# Patient Record
Sex: Female | Born: 1940 | Race: Black or African American | Hispanic: No | Marital: Single | Smoking: Former smoker
Health system: Southern US, Community
[De-identification: ages and names within clinical notes are randomized; demographics above are authoritative.]

## PROBLEM LIST (undated history)

## (undated) DIAGNOSIS — R7303 Prediabetes: Secondary | ICD-10-CM

## (undated) HISTORY — PX: BACK SURGERY: SHX140

---

## 1998-02-28 ENCOUNTER — Encounter: Admission: RE | Admit: 1998-02-28 | Discharge: 1998-05-27 | Payer: Self-pay | Admitting: Anesthesiology

## 1998-05-27 ENCOUNTER — Encounter: Admission: RE | Admit: 1998-05-27 | Discharge: 1998-08-15 | Payer: Self-pay | Admitting: Anesthesiology

## 1998-08-15 ENCOUNTER — Encounter: Admission: RE | Admit: 1998-08-15 | Discharge: 1998-11-13 | Payer: Self-pay | Admitting: Anesthesiology

## 1998-11-14 ENCOUNTER — Encounter: Admission: RE | Admit: 1998-11-14 | Discharge: 1999-01-23 | Payer: Self-pay | Admitting: Anesthesiology

## 1999-01-06 ENCOUNTER — Encounter: Payer: Self-pay | Admitting: Emergency Medicine

## 1999-01-06 ENCOUNTER — Emergency Department (HOSPITAL_COMMUNITY): Admission: EM | Admit: 1999-01-06 | Discharge: 1999-01-06 | Payer: Self-pay | Admitting: Emergency Medicine

## 1999-02-04 ENCOUNTER — Encounter: Admission: RE | Admit: 1999-02-04 | Discharge: 1999-05-05 | Payer: Self-pay | Admitting: Anesthesiology

## 1999-03-17 ENCOUNTER — Ambulatory Visit (HOSPITAL_COMMUNITY): Admission: RE | Admit: 1999-03-17 | Discharge: 1999-03-17 | Payer: Self-pay | Admitting: *Deleted

## 1999-05-22 ENCOUNTER — Encounter: Admission: RE | Admit: 1999-05-22 | Discharge: 1999-08-14 | Payer: Self-pay | Admitting: Anesthesiology

## 1999-08-14 ENCOUNTER — Encounter: Admission: RE | Admit: 1999-08-14 | Discharge: 1999-11-12 | Payer: Self-pay | Admitting: Anesthesiology

## 1999-12-08 ENCOUNTER — Encounter: Admission: RE | Admit: 1999-12-08 | Discharge: 2000-03-07 | Payer: Self-pay | Admitting: Anesthesiology

## 1999-12-19 ENCOUNTER — Encounter: Payer: Self-pay | Admitting: *Deleted

## 1999-12-19 ENCOUNTER — Ambulatory Visit (HOSPITAL_COMMUNITY): Admission: RE | Admit: 1999-12-19 | Discharge: 1999-12-19 | Payer: Self-pay | Admitting: *Deleted

## 2000-01-05 ENCOUNTER — Encounter: Admission: RE | Admit: 2000-01-05 | Discharge: 2000-01-05 | Payer: Self-pay | Admitting: General Surgery

## 2000-01-05 ENCOUNTER — Encounter: Payer: Self-pay | Admitting: General Surgery

## 2000-03-11 ENCOUNTER — Encounter: Admission: RE | Admit: 2000-03-11 | Discharge: 2000-06-09 | Payer: Self-pay | Admitting: Anesthesiology

## 2000-06-18 ENCOUNTER — Encounter: Admission: RE | Admit: 2000-06-18 | Discharge: 2000-09-16 | Payer: Self-pay | Admitting: Anesthesiology

## 2000-10-08 ENCOUNTER — Encounter: Admission: RE | Admit: 2000-10-08 | Discharge: 2001-01-06 | Payer: Self-pay | Admitting: Anesthesiology

## 2001-01-28 ENCOUNTER — Encounter: Admission: RE | Admit: 2001-01-28 | Discharge: 2001-03-05 | Payer: Self-pay | Admitting: Anesthesiology

## 2001-02-10 ENCOUNTER — Other Ambulatory Visit: Admission: RE | Admit: 2001-02-10 | Discharge: 2001-02-10 | Payer: Self-pay | Admitting: Family Medicine

## 2002-04-07 ENCOUNTER — Other Ambulatory Visit: Admission: RE | Admit: 2002-04-07 | Discharge: 2002-04-07 | Payer: Self-pay | Admitting: Family Medicine

## 2002-05-23 ENCOUNTER — Encounter: Admission: RE | Admit: 2002-05-23 | Discharge: 2002-08-21 | Payer: Self-pay | Admitting: Anesthesiology

## 2002-05-24 ENCOUNTER — Ambulatory Visit (HOSPITAL_COMMUNITY): Admission: RE | Admit: 2002-05-24 | Discharge: 2002-05-24 | Payer: Self-pay | Admitting: Family Medicine

## 2004-03-31 ENCOUNTER — Other Ambulatory Visit: Admission: RE | Admit: 2004-03-31 | Discharge: 2004-03-31 | Payer: Self-pay | Admitting: Family Medicine

## 2004-03-31 ENCOUNTER — Ambulatory Visit: Payer: Self-pay | Admitting: Family Medicine

## 2004-04-03 ENCOUNTER — Ambulatory Visit (HOSPITAL_COMMUNITY): Admission: RE | Admit: 2004-04-03 | Discharge: 2004-04-03 | Payer: Self-pay | Admitting: Family Medicine

## 2004-04-16 ENCOUNTER — Ambulatory Visit: Payer: Self-pay | Admitting: Family Medicine

## 2004-08-29 ENCOUNTER — Emergency Department (HOSPITAL_COMMUNITY): Admission: EM | Admit: 2004-08-29 | Discharge: 2004-08-30 | Payer: Self-pay | Admitting: Emergency Medicine

## 2008-05-04 ENCOUNTER — Observation Stay (HOSPITAL_COMMUNITY): Admission: EM | Admit: 2008-05-04 | Discharge: 2008-05-05 | Payer: Self-pay | Admitting: Emergency Medicine

## 2008-05-09 ENCOUNTER — Encounter: Admission: RE | Admit: 2008-05-09 | Discharge: 2008-05-09 | Payer: Self-pay | Admitting: Internal Medicine

## 2010-11-18 NOTE — Discharge Summary (Signed)
Joy Moreno, Joy Moreno NO.:  [id number]   MEDICAL RECORD NO.:  [id number]          PATIENT TYPE:  INP   LOCATION:  3731                         FACILITY:  [facility]   PHYSICIAN:  Peggye Pitt, M.D. DATE OF BIRTH:  Oct 16, 1940   DATE OF ADMISSION:  05/03/2008  DATE OF DISCHARGE:  05/05/2008                               DISCHARGE SUMMARY   DISCHARGE DIAGNOSES:  1. Chest pain, resolved.  Ruled out for acute myocardial infarction.  2. Newly diagnosed type 2 diabetes.  3. Newly diagnosed hypertension.  4. Newly diagnosed hyperlipidemia.   DISCHARGE MEDICATIONS:  1. Glipizide 5 mg p.o. b.i.d.  2. Metformin 500 mg p.o. nightly.  3. Zocor 20 mg p.o. nightly.  4. Lisinopril 20 mg p.o. daily.  5. Metoprolol 12.5 mg p.o. b.i.d.   DISPOSITION AND FOLLOWUP:  The patient is discharged home in stable  condition.  However, she will need close followup with a primary care  physician as she has had newly diagnosed diabetes, hypertension, and  hyperlipidemia.  She has decided that she will like to go to [facility]  to continue her medical care.   Images performed this hospitalization include, chest x-ray on May 04, 2008, had showed no acute cardiopulmonary disease.   CONSULTATION:  This hospitalization none.   HISTORY AND PHYSICAL:  For full details, please refer to history and  physical exam dictated by Dr. Titus Mould on May 03, 2008.  But in  brief, Joy Moreno is a 70 year old African American woman with no  reported medical history who presented to the emergency department with  complaints of chest pain.  She stated this happened when she was  sitting, watching TV, and lasted only seconds.  For this reason, she  called the EMS and they brought her in for further evaluation.   HOSPITAL COURSE:  1. Chest pain, which is very atypical in nature.  It resolved      spontaneously even before EMS brought her to the hospital.  She      ruled out for acute myocardial  infarction with 3 sets of negative      cardiac enzymes and no acute EKG changes.  She has been advised      that if chest pain occurs, she should come in for a stress test as      she does have some risk factors.  But given the fact that her chest      pain is so atypical, I doubt this is cardiac in nature.  2. Type 2 diabetes, which is a new diagnosis for her.  Hemoglobin A1c      is pending at the time of this dictation.  She was started on      glipizide while in the hospital and she was also on a sliding scale      insulin.  Upon discharge, I will send her home on glipizide 5 mg      p.o. b.i.d. in addition to metformin 500 mg p.o. at bedtime.  This      can be further titrated as an outpatient.  I have also given her a  prescription for a meter, test strips and lancets.  She has      received diabetic education while in the hospital.  She will need      close followup as an outpatient.  Her initial CBG upon arrival was      500, and this was fasting.  Her CBGs while in the hospital have      maintained in the 200-300 range.  3. Hypertension, which is also a new diagnosis for her with an initial      blood pressure of 150/90.  We started her on lisinopril and      Lopressor, and she has maintained good blood pressures.  4. Hyperlipidemia which is also a new diagnosis for her.  Her fasting      lipid panel is as follows:  Total cholesterol 187, LDL 142, HDL of      26, and triglycerides of 97.  We did CMET that showed no liver      function test abnormalities, so we have started her on the statin,      Zocor 20 mg p.o. daily.  Recommend rechecking CMET and FLP in 6 to      8 weeks to further titrate the statin as necessary and to ensure      that she has no LFT changes.  5. For her GERD, she was kept on a PPI while in the hospital.   VITAL SIGNS UPON DISCHARGE:  Blood pressure 115/66, heart rate 59,  respirations 20, and O2 sats 100% on room air with a temp of 97.9.    LABORATORIES ON DISCHARGE:  Sodium 132, potassium 4.3, chloride 102,  bicarb 26, BUN 9, creatinine 1.0, and a glucose of 221.  WBCs 7.4,  hemoglobin 10.5, and platelets 229.      Peggye Pitt, M.D.  Electronically Signed     EH/MEDQ  D:  05/05/2008  T:  05/05/2008  Job:  [id number]   cc:   Maurice March, M.D.

## 2010-11-18 NOTE — H&P (Signed)
NAMEMarland Kitchen  JASMAN, PFEIFLE NO.:  [id number]   MEDICAL RECORD NO.:  [id number]          PATIENT TYPE:  INP   LOCATION:  3731                         FACILITY:  [facility]   PHYSICIAN:  Vinnie Level, MD    DATE OF BIRTH:  11/03/1940   DATE OF ADMISSION:  05/03/2008  DATE OF DISCHARGE:                              HISTORY & PHYSICAL   PRIMARY CARE PHYSICIAN:  None.   CHIEF COMPLAINT:  Chest pain.   HISTORY OF PRESENT ILLNESS:  The patient is a 70 year old African  American female with history of GERD and no reported medical history  though the EMR does note lower back pain and hypertension who presented  to the emergency department with substernal chest pain.  She notes this  occurred while she was sitting watching TV.  It happened as she started  to get out of her chair.  She states it lasted seconds.  She immediately  set back down and the pain disappeared.  She describes it has chest  pressure.  She did have some associated bilateral hand clamminess.  She  note she briefly saw stars, was unable to rate this pain on a 1-10  scale.  She had no frank diaphoresis.  No nausea or vomiting associated  with this.  She notes she does walk approximately 4 blocks daily without  issue.  She denies any heart failure symptoms including lower extremity  edema, PND, or orthopnea.  She does have notable polydipsia.  No frank  polyuria.  She notes she called 911 due to the fact that she lives alone  and she was concerned that something bad was happening.   PAST MEDICAL HISTORY:  1. Lower back pain status post surgery in 1991.  2. GERD.  3. Question history of hypertension.  4. New diagnosis of diabetes here today.   ALLERGIES:  SULFA meds which cause nausea and ASPIRIN which causes her  heart to flutter.   MEDICATIONS:  She does not take anything at home including herbals or  supplements.   FAMILY HISTORY:  Father died of peptic ulcer disease.  Her mother is  alive at 44 and  healthy.  She does have a sister who had an MI in her  70s and a brother with diabetes.   SOCIAL HISTORY:  Denies tobacco, alcohol, or drug use.  She is disabled.  She was previously was an Museum/gallery exhibitions officer.   REVIEW OF SYSTEMS:  A full 14-point review of systems was completed and  is negative except as noted above in the HPI.   PHYSICAL EXAMINATION:  VITAL SIGNS:  Temperature is 98.0, pulse 80-92,  blood pressure 140-150/81-85, sats are 98% on room air, and respiratory  rate 18.  GENERAL:  No acute distress.  Alert and oriented x4.  HEENT:  Normocephalic and atraumatic.  Pupils are equal, round, reactive  to light and accommodation.  Extraocular muscles are intact.  Mucous  membranes are moist.  Sclerae anicteric.  NECK:  No JVD.  No bruits.  LYMPHS:  No cervical lymphadenopathy.  CHEST:  Clear to acculturation bilaterally.  CARDIAC:  Soft.  A 2/6 murmur,  loudest at the base.  S1 and S2 are  appreciated.  A 2+ dorsalis pedis.  Posterior, tibial, and radial pulses  are equal.  ABDOMEN:  Obese, nontender, and nondistended.  Normoactive bowel sounds.  EXTREMITIES:  Without clubbing, cyanosis, or edema.  MUSCULOSKELETAL:  Intact.  Full range of motion.  No obvious deformity.  SKIN:  Clean, dry, and intact without rash.   LABORATORY DATA:  White count 8.3, platelet count 272, and hemoglobin is  12.0.  Sodium is 131, potassium 5.1, chloride 94, bicarb 28, BUN 19,  creatinine 1.1, and glucose is 510.  Two sets of cardiac enzymes were  reported; first set at 28 minutes after midnight.  CK-MB was less than  1.0, troponin was less than 0.05, and myoglobin was 95.5 and second set  at 2:31 a.m. approximately 2 hours later again CK-MB was less than 1.0,  troponin less than 0.05, and myoglobin was 55.6.   EKG reveals normal sinus rhythm with no ST or T wave changes.  Chest x-  ray reveals no acute cardiopulmonary disease.   ASSESSMENT AND PLAN:  A 70 year old African American female with   hypertension and new diagnosis of diabetes, GERD, and positive family  history who presents with transient slightly atypical chest pain.  1. Chest pain.  Rule out for acute coronary syndrome with enzymes,      telemetry and EKG.  She does have an aspirin allergy and was given      Plavix here in the emergency room I concur and agree with that,      although we will start on ACE inhibitor and beta-blocker as she      appears to have some evidence of hypertension.  We will do routine      screening including hemoglobin A1c and a fasting lipid profile.  We      will set her up for an exercise stress in the morning assuming her      third set of enzymes are negative.  They are negative x2 sets only      over 2 hours at present.  2. Diabetes.  This is a new diagnosis for her.  We will send a      hemoglobin A1c at the present time, but with the fasting blood      sugar in the 500s approximately 6 hours after food, I suspect she      will likely require home insulin.  We are starting her on Glucotrol      now.  No metformin while she is inpatient and they concerned for      lactic acidosis.  She also has further potential for tests      including heart catheterization.  Continue to educate her about the      importance of treating her diabetes.  I will put her on carb      consistent diet when she is able to eat after being n.p.o.  3. Hypertension.  ACE inhibitor and beta-blocker as above, both are      new drugs for her.  4. Prophylaxis, Lovenox.   DISPOSITION:  Testing as above.  Need to establish immediately a new  primary care physician.      Vinnie Level, MD  Electronically Signed     PMB/MEDQ  D:  05/04/2008  T:  05/04/2008  Job:  [id number]

## 2010-11-21 NOTE — Consult Note (Signed)
[facility]  Patient:    Joy Moreno, Joy Moreno                    MRN: [id number] Proc. Date: 06/18/00 Attending:  Thyra Breed, M.D. CC:         [facility]  Ricky D. Gasper Sells, M.D.   Consultation Report  FOLLOW-UP EVALUATION  [name] comes in for follow-up evaluation of her chronic low-back pain on the basis of lumbar degenerative disk disease.  Since her previous evaluation, the patient has done remarkably well with the use of her medications which include methadone and Flexeril with her TENS unit.  She travelled down to [place] and back with minimal increased discomfort.  She is in good spirits for the holidays.  She has minimal complaints today.  PHYSICAL EXAMINATION:  Blood pressure is 134/77.  Heart rate is 91, respiratory rates 20, O2 saturations 97%.  Pain level is 6/10.  Straight leg raise signs are negative, and deep tendon reflexes are symmetric at the knees and ankles.  She does have some pain on range of motion of her knees.  IMPRESSION: 1. Low-back pain on the basis of lumbar degenerative disk disease    predominantly which is stable on current medical regimen. 2. Other medical problems per primary care physician.  DISPOSITION: 1. Continue on methadone but will drop back to 5 mg, 1 p.o. q.6h. #120 with no    refill. 2. Continue with Flexeril prn. 3. Follow up with me in eight weeks. DD:  06/18/00 TD:  06/18/00 Job: [id number]

## 2011-04-07 LAB — DIFFERENTIAL
Eosinophils Relative: 0
Monocytes Absolute: 0.6
Neutro Abs: 4
Neutrophils Relative %: 48

## 2011-04-07 LAB — BASIC METABOLIC PANEL
CO2: 26
Creatinine, Ser: 1
GFR calc Af Amer: >60
GFR calc non Af Amer: 55 — ABNORMAL LOW
Glucose, Bld: 221 — ABNORMAL HIGH
Sodium: 132 — ABNORMAL LOW

## 2011-04-07 LAB — GLUCOSE, CAPILLARY
Glucose-Capillary: 189 — ABNORMAL HIGH
Glucose-Capillary: 222 — ABNORMAL HIGH
Glucose-Capillary: 222 — ABNORMAL HIGH
Glucose-Capillary: 241 — ABNORMAL HIGH
Glucose-Capillary: 257 — ABNORMAL HIGH
Glucose-Capillary: 347 — ABNORMAL HIGH
Glucose-Capillary: 396 — ABNORMAL HIGH

## 2011-04-07 LAB — CARDIAC PANEL(CRET KIN+CKTOT+MB+TROPI)
CK, MB: 0.7
Relative Index: 0.5
Total CK: 141
Troponin I: <0.01

## 2011-04-07 LAB — POCT I-STAT, CHEM 8
BUN: 19
Calcium, Ion: 1.18
Chloride: 94 — ABNORMAL LOW
Creatinine, Ser: 1.1
Glucose, Bld: 510
HCT: 41
Hemoglobin: 13.9
Potassium: 5.1
Sodium: 131 — ABNORMAL LOW
TCO2: 28

## 2011-04-07 LAB — LIPID PANEL
Cholesterol: 187
HDL: 26 — ABNORMAL LOW
LDL Cholesterol: 142 — ABNORMAL HIGH
Total CHOL/HDL Ratio: 6.9
Total CHOL/HDL Ratio: 7.2
Triglycerides: 97
Triglycerides: 98
VLDL: 19
VLDL: 20

## 2011-04-07 LAB — CBC
HCT: 31.4 — ABNORMAL LOW
Hemoglobin: 10.5 — ABNORMAL LOW
MCHC: 33.4
MCV: 81.3
MCV: 81.8
Platelets: 229
RBC: 3.84 — ABNORMAL LOW
RBC: 4.44
RDW: 15.3
WBC: 7.4
WBC: 8.3

## 2011-04-07 LAB — POCT CARDIAC MARKERS
CKMB, poc: <1 — ABNORMAL LOW
CKMB, poc: <1 — ABNORMAL LOW
Myoglobin, poc: 55.6
Myoglobin, poc: 95.5
Troponin i, poc: <0.05
Troponin i, poc: <0.05

## 2011-04-07 LAB — TSH: TSH: 4.649 — ABNORMAL HIGH

## 2011-04-07 LAB — BASIC METABOLIC PANEL WITH GFR
BUN: 9
Calcium: 8.7
Chloride: 102
Potassium: 4.3

## 2011-04-07 LAB — CK TOTAL AND CKMB (NOT AT ARMC)
CK, MB: 0.5
Relative Index: 0.5
Total CK: 102

## 2011-04-07 LAB — HEMOGLOBIN A1C
Hgb A1c MFr Bld: 12.1 — ABNORMAL HIGH
Mean Plasma Glucose: 301

## 2011-04-07 LAB — TROPONIN I: Troponin I: 0.01

## 2012-05-04 ENCOUNTER — Emergency Department (HOSPITAL_COMMUNITY)
Admission: EM | Admit: 2012-05-04 | Discharge: 2012-05-04 | Disposition: A | Discharge status: Home / Self Care | Payer: Medicare Other | Attending: Emergency Medicine | Admitting: Emergency Medicine

## 2012-05-04 ENCOUNTER — Encounter (HOSPITAL_COMMUNITY): Payer: Self-pay | Admitting: Emergency Medicine

## 2012-05-04 ENCOUNTER — Emergency Department (HOSPITAL_COMMUNITY): Payer: Medicare Other

## 2012-05-04 DIAGNOSIS — E119 Type 2 diabetes mellitus without complications: Secondary | ICD-10-CM | POA: Insufficient documentation

## 2012-05-04 DIAGNOSIS — Z87891 Personal history of nicotine dependence: Secondary | ICD-10-CM | POA: Insufficient documentation

## 2012-05-04 DIAGNOSIS — M549 Dorsalgia, unspecified: Secondary | ICD-10-CM | POA: Insufficient documentation

## 2012-05-04 HISTORY — DX: Prediabetes: R73.03

## 2012-05-04 MED ORDER — TRAMADOL HCL 50 MG PO TABS
50.0000 mg | ORAL_TABLET | Freq: Once | ORAL | Status: AC
  Administered 2012-05-04: 50 mg via ORAL
  Filled 2012-05-04: qty 1

## 2012-05-04 MED ORDER — IBUPROFEN 400 MG PO TABS: 400.0000 mg | ORAL_TABLET | Freq: Three times a day (TID) | ORAL | Status: AC

## 2012-05-04 MED ORDER — DIAZEPAM 5 MG PO TABS: 5.0000 mg | ORAL_TABLET | Freq: Two times a day (BID) | ORAL | Status: DC | PRN

## 2012-05-04 MED ORDER — DIAZEPAM 5 MG PO TABS
5.0000 mg | ORAL_TABLET | Freq: Once | ORAL | Status: AC
  Administered 2012-05-04: 5 mg via ORAL
  Filled 2012-05-04: qty 1

## 2012-05-04 MED ORDER — TRAMADOL HCL 50 MG PO TABS
50.0000 mg | ORAL_TABLET | Freq: Three times a day (TID) | ORAL | Status: DC | PRN
Start: 2012-05-04 — End: 2024-03-16

## 2012-05-04 MED ORDER — IBUPROFEN 800 MG PO TABS
800.0000 mg | ORAL_TABLET | Freq: Once | ORAL | Status: AC
  Administered 2012-05-04: 800 mg via ORAL
  Filled 2012-05-04: qty 1

## 2012-05-04 NOTE — ED Notes (Signed)
Pt stated that she was doing aerobics and hurt her lower back and right leg. Stated that pain starts from back and goes all the way down to toes. No swelling noted.

## 2012-05-04 NOTE — ED Provider Notes (Signed)
History     CSN: [id number]  Arrival date & time 05/04/12  0816   First MD Initiated Contact with Patient 05/04/12 0820      Chief Complaint  Patient presents with  . Leg Pain  . Back Pain    HPI  The patient presents with concerns of low back and leg pain.  The pain is sharp, radiates from her lower back down her right leg distally.  The pain began after the patient was doing aerobic exercises.  The pain is minimally improved with Advil.  She denies concurrent distal dysesthesia or weakness, dysuria, abdominal pain, fevers, chills, any other focal complaints.  Past Medical History  Diagnosis Date  . Borderline diabetic     Past Surgical History  Procedure Date  . Back surgery   . Cesarean section     No family history on file.  History  Substance Use Topics  . Smoking status: Former Smoker    Types: Cigarettes  . Smokeless tobacco: Not on file  . Alcohol Use: No    OB History    Grav Para Term Preterm Abortions TAB SAB Ect Mult Living                  Review of Systems  Constitutional:       Per HPI, otherwise negative  HENT:       Per HPI, otherwise negative  Eyes: Negative.   Respiratory:       Per HPI, otherwise negative  Cardiovascular:       Per HPI, otherwise negative  Gastrointestinal: Negative for nausea, vomiting and diarrhea.  Genitourinary: Negative for dysuria, urgency, hematuria and flank pain.  Musculoskeletal:       Per HPI, otherwise negative  Skin: Negative for pallor, rash and wound.  Neurological: Negative for weakness.    Allergies  Sulfa antibiotics and Aspirin  Home Medications   Current Outpatient Rx  Name Route Sig Dispense Refill  . IBUPROFEN 200 MG PO TABS Oral Take 400 mg by mouth every 6 (six) hours as needed. For pain      BP 123/77  Pulse 83  Temp 98.1 F (36.7 C) (Oral)  Resp 18  SpO2 100%  Physical Exam  Nursing note and vitals reviewed. Constitutional: She is oriented to person, place, and time. She  appears well-developed and well-nourished. No distress.  HENT:  Head: Normocephalic and atraumatic.  Eyes: Conjunctivae normal and EOM are normal.  Cardiovascular: Normal rate and regular rhythm.   Pulmonary/Chest: Effort normal and breath sounds normal. No stridor. No respiratory distress.  Abdominal: She exhibits no distension.  Musculoskeletal: She exhibits no edema.  Neurological: She is alert and oriented to person, place, and time. No cranial nerve deficit.       Equal strength in both LE throughout, though with flex /ext of the ankle and knee on the R she c/o pain throughout the extremity.  No edema / erythema / wounds / size difference.  Skin: Skin is warm and dry.  Psychiatric: She has a normal mood and affect.    ED Course  Procedures (including critical care time)  Labs Reviewed - No data to display Dg Lumbar Spine Complete  05/04/2012  *RADIOLOGY REPORT*  Clinical Data: Low back pain.  LUMBAR SPINE - COMPLETE 4+ VIEW  Comparison: None.  Findings: Slight disc space narrowing at L5-S1.  Early degenerative spurring.  No fracture or malalignment.  No fracture or subluxation.  SI joints are symmetric and unremarkable.  Suspect  postoperative changes at the L5 level.  IMPRESSION: No acute bony abnormality.   Original Report Authenticated By: Cyndie Chime, M.D.      No diagnosis found.    MDM  This pleasant elderly female now presents with ongoing right leg and low back pain.  Given the patient's description of pain that began following continued aerobic exercise there suspicion of musculoskeletal etiology.  The patient has no documented medical problems, and no risk factors for it his such as DVT.  The patient is neurovascularly intact with appropriate strength throughout the extremity.  The patient has unremarkable vital signs, with her tachycardia from the triage note improved.  Given the patient's description of pain, and her history of prior surgery a x-ray was performed.   This was unremarkable.  I discussed the need for continued primary care management of her back and leg pain, but may be secondary to sciatica versus musculoskeletal strain.  Meds and other concerning for prescription worse, and was unremarkable vital signs, and a reassuring physical exam the patient was appropriate for d/c.   Gerhard Munch, MD 05/04/12 [phone]

## 2012-05-04 NOTE — ED Notes (Signed)
Patient returned from [place], RN notified.

## 2016-03-11 ENCOUNTER — Encounter (HOSPITAL_COMMUNITY): Payer: Self-pay | Admitting: Emergency Medicine

## 2016-03-11 ENCOUNTER — Emergency Department (HOSPITAL_COMMUNITY)
Admission: EM | Admit: 2016-03-11 | Discharge: 2016-03-11 | Disposition: A | Discharge status: Home / Self Care | Payer: Medicare Other | Attending: Physician Assistant | Admitting: Physician Assistant

## 2016-03-11 DIAGNOSIS — N390 Urinary tract infection, site not specified: Secondary | ICD-10-CM | POA: Diagnosis not present

## 2016-03-11 DIAGNOSIS — Z87891 Personal history of nicotine dependence: Secondary | ICD-10-CM | POA: Diagnosis not present

## 2016-03-11 DIAGNOSIS — R1013 Epigastric pain: Secondary | ICD-10-CM | POA: Insufficient documentation

## 2016-03-11 DIAGNOSIS — R109 Unspecified abdominal pain: Secondary | ICD-10-CM

## 2016-03-11 LAB — CBC
HEMATOCRIT: 38.1 % (ref 36.0–46.0)
HEMOGLOBIN: 12.6 g/dL (ref 12.0–15.0)
MCH: 27.9 pg (ref 26.0–34.0)
MCHC: 33.1 g/dL (ref 30.0–36.0)
MCV: 84.5 fL (ref 78.0–100.0)
Platelets: 218 10*3/uL (ref 150–400)
RBC: 4.51 MIL/uL (ref 3.87–5.11)
RDW: 13.7 % (ref 11.5–15.5)
WBC: 8.2 10*3/uL (ref 4.0–10.5)

## 2016-03-11 LAB — URINE MICROSCOPIC-ADD ON: RBC / HPF: NONE SEEN RBC/hpf (ref 0–5)

## 2016-03-11 LAB — COMPREHENSIVE METABOLIC PANEL
ALBUMIN: 3.3 g/dL — AB (ref 3.5–5.0)
ALT: 21 U/L (ref 14–54)
ANION GAP: 7 (ref 5–15)
AST: 20 U/L (ref 15–41)
Alkaline Phosphatase: 83 U/L (ref 38–126)
BUN: 16 mg/dL (ref 6–20)
CALCIUM: 9.5 mg/dL (ref 8.9–10.3)
CHLORIDE: 101 mmol/L (ref 101–111)
CO2: 25 mmol/L (ref 22–32)
Creatinine, Ser: 1 mg/dL (ref 0.44–1.00)
GFR calc non Af Amer: 54 mL/min — ABNORMAL LOW (ref >60)
GLUCOSE: 335 mg/dL — AB (ref 65–99)
POTASSIUM: 4.4 mmol/L (ref 3.5–5.1)
SODIUM: 133 mmol/L — AB (ref 135–145)
Total Bilirubin: 0.3 mg/dL (ref 0.3–1.2)
Total Protein: 8.2 g/dL — ABNORMAL HIGH (ref 6.5–8.1)

## 2016-03-11 LAB — LIPASE, BLOOD: LIPASE: 40 U/L (ref 11–51)

## 2016-03-11 LAB — URINALYSIS, ROUTINE W REFLEX MICROSCOPIC
BILIRUBIN URINE: NEGATIVE
Glucose, UA: >1000 mg/dL — AB
Ketones, ur: NEGATIVE mg/dL
Nitrite: NEGATIVE
PH: 5 (ref 5.0–8.0)
Protein, ur: NEGATIVE mg/dL
SPECIFIC GRAVITY, URINE: 1.034 — AB (ref 1.005–1.030)

## 2016-03-11 LAB — CBG MONITORING, ED
Glucose-Capillary: 247 mg/dL — ABNORMAL HIGH (ref 65–99)
Glucose-Capillary: 203 mg/dL — ABNORMAL HIGH (ref 65–99)

## 2016-03-11 MED ORDER — FAMOTIDINE IN NACL 20-0.9 MG/50ML-% IV SOLN
20.0000 mg | Freq: Once | INTRAVENOUS | Status: AC
  Administered 2016-03-11: 20 mg via INTRAVENOUS
  Filled 2016-03-11: qty 50

## 2016-03-11 MED ORDER — ONDANSETRON HCL 4 MG/2ML IJ SOLN
4.0000 mg | Freq: Once | INTRAMUSCULAR | Status: AC
  Administered 2016-03-11: 4 mg via INTRAVENOUS
  Filled 2016-03-11: qty 2

## 2016-03-11 MED ORDER — CEPHALEXIN 500 MG PO CAPS: 500.0000 mg | ORAL_CAPSULE | Freq: Four times a day (QID) | ORAL | 0 refills | Status: DC

## 2016-03-11 MED ORDER — FAMOTIDINE 20 MG PO TABS: 20.0000 mg | ORAL_TABLET | Freq: Two times a day (BID) | ORAL | 0 refills | Status: DC

## 2016-03-11 MED ORDER — INSULIN ASPART 100 UNIT/ML ~~LOC~~ SOLN
3.0000 [IU] | Freq: Once | SUBCUTANEOUS | Status: AC
  Administered 2016-03-11: 3 [IU] via SUBCUTANEOUS
  Filled 2016-03-11: qty 1

## 2016-03-11 MED ORDER — SODIUM CHLORIDE 0.9 % IV BOLUS (SEPSIS)
1000.0000 mL | Freq: Once | INTRAVENOUS | Status: AC
  Administered 2016-03-11: 1000 mL via INTRAVENOUS

## 2016-03-11 MED ORDER — INSULIN ASPART 100 UNIT/ML ~~LOC~~ SOLN: 6.0000 [IU] | Freq: Once | SUBCUTANEOUS | Status: DC

## 2016-03-11 NOTE — ED Provider Notes (Signed)
[facility] Provider Note   CSN: [id number] Arrival date & time: 03/11/16  1011     History   Chief Complaint Chief Complaint  Patient presents with  . Abdominal Pain  . Urinary Tract Infection    HPI Joy Moreno is a 75 y.o. female.  Patient is a 75 year old female with no pertinent past medical history presents with complaint of upper abdominal pain, onset this morning. Patient reports having burning pain to her upper abdomen that started this morning and has remained constant. She reports her pain feels similar to episodes she has had in the past related to her stomach ulcers. Endorses associated nausea. Patient also notes that she has had urinary frequency for the past month. Denies fever, chills, chest pain, shortness of breath, vomiting, diarrhea, constipation, blood in urine or stool, vaginal bleeding, vaginal discharge, weakness. Patient denies taking any medications at home and notes she currently is not taking any medications for her ulcers. Denies use of NSAIDs. Endorses abdominal surgical history of C-section.      Past Medical History:  Diagnosis Date  . Borderline diabetic     There are no active problems to display for this patient.   Past Surgical History:  Procedure Laterality Date  . BACK SURGERY    . CESAREAN SECTION      OB History    No data available       Home Medications    Prior to Admission medications   Medication Sig Start Date End Date Taking? Authorizing Provider  cephALEXin (KEFLEX) 500 MG capsule Take 1 capsule (500 mg total) by mouth 4 (four) times daily. 03/11/16   Barrett Henle, PA-C  diazepam (VALIUM) 5 MG tablet Take 1 tablet (5 mg total) by mouth every 12 (twelve) hours as needed (muscle spasm). 05/04/12   Gerhard Munch, MD  famotidine (PEPCID) 20 MG tablet Take 1 tablet (20 mg total) by mouth 2 (two) times daily. 03/11/16   Barrett Henle, PA-C  ibuprofen (ADVIL,MOTRIN) 200 MG tablet Take 400 mg  by mouth every 6 (six) hours as needed. For pain    Historical Provider, MD  traMADol (ULTRAM) 50 MG tablet Take 1 tablet (50 mg total) by mouth every 8 (eight) hours as needed for pain. 05/04/12   Gerhard Munch, MD    Family History History reviewed. No pertinent family history.  Social History Social History  Substance Use Topics  . Smoking status: Former Smoker    Types: Cigarettes  . Smokeless tobacco: Not on file  . Alcohol use No     Allergies   Sulfa antibiotics and Aspirin   Review of Systems Review of Systems  Gastrointestinal: Positive for abdominal pain and nausea.  Genitourinary: Positive for frequency.  All other systems reviewed and are negative.    Physical Exam Updated Vital Signs BP 120/73   Pulse (!) 56   Temp 98.1 F (36.7 C) (Oral)   Resp 18   SpO2 100%   Physical Exam  Constitutional: She is oriented to person, place, and time. She appears well-developed and well-nourished. No distress.  HENT:  Head: Normocephalic and atraumatic.  Mouth/Throat: Oropharynx is clear and moist. No oropharyngeal exudate.  Eyes: Conjunctivae and EOM are normal. Right eye exhibits no discharge. Left eye exhibits no discharge. No scleral icterus.  Neck: Normal range of motion. Neck supple.  Cardiovascular: Normal rate, regular rhythm, normal heart sounds and intact distal pulses.   Pulmonary/Chest: Effort normal and breath sounds normal. No respiratory distress. She has  no wheezes. She has no rales. She exhibits no tenderness.  Abdominal: Soft. Bowel sounds are normal. She exhibits no distension and no mass. There is tenderness (epigastric). There is no rigidity, no rebound, no guarding, no CVA tenderness, no tenderness at McBurney's point and negative Murphy's sign. No hernia.  No CVA tenderness  Musculoskeletal: She exhibits no edema.  Neurological: She is alert and oriented to person, place, and time.  Skin: Skin is warm and dry. She is not diaphoretic.  Nursing  note and vitals reviewed.    ED Treatments / Results  Labs (all labs ordered are listed, but only abnormal results are displayed) Labs Reviewed  COMPREHENSIVE METABOLIC PANEL - Abnormal; Notable for the following:       Result Value   Sodium 133 (*)    Glucose, Bld 335 (*)    Total Protein 8.2 (*)    Albumin 3.3 (*)    GFR calc non Af Amer 54 (*)    All other components within normal limits  URINALYSIS, ROUTINE W REFLEX MICROSCOPIC (NOT AT [facility]) - Abnormal; Notable for the following:    APPearance CLOUDY (*)    Specific Gravity, Urine 1.034 (*)    Glucose, UA >1000 (*)    Hgb urine dipstick TRACE (*)    Leukocytes, UA SMALL (*)    All other components within normal limits  URINE MICROSCOPIC-ADD ON - Abnormal; Notable for the following:    Squamous Epithelial / LPF 0-5 (*)    Bacteria, UA MANY (*)    All other components within normal limits  CBG MONITORING, ED - Abnormal; Notable for the following:    Glucose-Capillary 247 (*)    All other components within normal limits  CBG MONITORING, ED - Abnormal; Notable for the following:    Glucose-Capillary 203 (*)    All other components within normal limits  URINE CULTURE  LIPASE, BLOOD  CBC    EKG  EKG Interpretation None       Radiology No results found.  Procedures Procedures (including critical care time)  Medications Ordered in ED Medications  ondansetron (ZOFRAN) injection 4 mg (4 mg Intravenous Given 03/11/16 1153)  sodium chloride 0.9 % bolus 1,000 mL (0 mLs Intravenous Stopped 03/11/16 1520)  famotidine (PEPCID) IVPB 20 mg premix (0 mg Intravenous Stopped 03/11/16 1520)  insulin aspart (novoLOG) injection 3 Units (3 Units Subcutaneous Given 03/11/16 1352)     Initial Impression / Assessment and Plan / ED Course  I have reviewed the triage vital signs and the nursing notes.  Pertinent labs & imaging results that were available during my care of the patient were reviewed by me and considered in my medical  decision making (see chart for details).  Clinical Course    Patient presented with epigastric pain and associated nausea. She also states she has had urinary frequency for the past month. Denies fever, vomiting or diarrhea. History of peptic ulcer disease. Patient denies taking any medications. VSS. Exam revealed mild epigastric tenderness, remaining exam unremarkable. Patient given IV fluids, Pepcid and Zofran. Glucose 335, no anion gap. Patient given 1 L of IV fluids and insulin. UA consistent with UTI, urine culture sent. Remaining labs unremarkable. On reevaluation patient reports her symptoms have significantly improved. Patient able to tolerate PO. I suspect patient's symptoms are likely due to her history of PUD. I do not suspect appendicitis, bowel obstruction, perforation or an acute surgical abdomen do not feel any further imaging or workup is warranted at this time. Repeat  CBG 203. Discussed results and plan for discharge with patient. Plan to discharge patient home with Pepcid, Keflex and symptomatic treatment. Advised patient to follow up with her PCP within the next week to have her glucose rechecked. Discussed strict return precautions with patient.  Final Clinical Impressions(s) / ED Diagnoses   Final diagnoses:  UTI (lower urinary tract infection)  Abdominal pain, unspecified abdominal location    New Prescriptions New Prescriptions   CEPHALEXIN (KEFLEX) 500 MG CAPSULE    Take 1 capsule (500 mg total) by mouth 4 (four) times daily.   FAMOTIDINE (PEPCID) 20 MG TABLET    Take 1 tablet (20 mg total) by mouth 2 (two) times daily.     Satira Sark [place], [place] 03/11/16 1540    Courteney Randall An, MD 03/13/16 1030

## 2016-03-11 NOTE — ED Triage Notes (Signed)
Pt sts abd pain from her ulcers and UTI sx x 3 days

## 2016-03-11 NOTE — Discharge Instructions (Signed)
Take your medications as prescribed. Continue drinking fluids at home to remain hydrated. I recommend following up with your primary care provider within the next week to have your glucose rechecked and follow-up regarding your peptic ulcer disease. Please return to the Emergency Department if symptoms worsen or new onset of fever, chest pain, difficulty breathing, abdominal pain, vomiting, unable to keep fluids down, blood in stool or vomit.

## 2016-03-12 LAB — URINE CULTURE

## 2018-12-02 ENCOUNTER — Ambulatory Visit (INDEPENDENT_AMBULATORY_CARE_PROVIDER_SITE_OTHER): Payer: Medicare Other | Admitting: Podiatry

## 2018-12-02 ENCOUNTER — Other Ambulatory Visit: Payer: Self-pay

## 2018-12-02 VITALS — BP 166/92 | HR 87 | Temp 97.6°F

## 2018-12-02 DIAGNOSIS — M79674 Pain in right toe(s): Secondary | ICD-10-CM

## 2018-12-02 DIAGNOSIS — M2041 Other hammer toe(s) (acquired), right foot: Secondary | ICD-10-CM | POA: Diagnosis not present

## 2018-12-02 DIAGNOSIS — M2042 Other hammer toe(s) (acquired), left foot: Secondary | ICD-10-CM | POA: Diagnosis not present

## 2018-12-02 DIAGNOSIS — M79675 Pain in left toe(s): Secondary | ICD-10-CM

## 2018-12-02 NOTE — Progress Notes (Signed)
Subjective:  Patient ID: Joy Moreno, female    DOB: 11-20-1940,  MRN: [id number]  Chief Complaint  Patient presents with  . Foot Problem    Pt removed some dead skin sub 1st digits and now states this skin is sore. Pt states 77moduration. No drainage.    78y.o. female presents with the above complaint. Hx above confirmed with patient.   Review of Systems: Negative except as noted in the HPI. Denies N/V/F/Ch.  Past Medical History:  Diagnosis Date  . Borderline diabetic     Current Outpatient Medications:  .  Accu-Chek FastClix Lancets MISC, CHECK BLOOD SUGAR 3 TIMES PER DAY, Disp: , Rfl:  .  ACCU-CHEK GUIDE test strip, use to check blood sugars three times daily, Disp: , Rfl:  .  atorvastatin (LIPITOR) 20 MG tablet, take 1 tablet (20 mg) by oral route once daily for high cholesterol, Disp: , Rfl:  .  Blood Glucose Monitoring Suppl (ACCU-CHEK GUIDE) w/Device KIT, USE METER 3 TIMES DAILY TO CHECK BS, Disp: , Rfl:  .  cephALEXin (KEFLEX) 500 MG capsule, Take 1 capsule (500 mg total) by mouth 4 (four) times daily., Disp: 28 capsule, Rfl: 0 .  diazepam (VALIUM) 5 MG tablet, Take 1 tablet (5 mg total) by mouth every 12 (twelve) hours as needed (muscle spasm)., Disp: 10 tablet, Rfl: 0 .  famotidine (PEPCID) 20 MG tablet, Take 1 tablet (20 mg total) by mouth 2 (two) times daily., Disp: 30 tablet, Rfl: 0 .  glipiZIDE (GLUCOTROL) 5 MG tablet, TAKE 1 TABLET (5 MG) BY ORAL ROUTE ONCE DAILY BEFORE A MEAL FOR 30 DAYS, Disp: , Rfl:  .  ibuprofen (ADVIL,MOTRIN) 200 MG tablet, Take 400 mg by mouth every 6 (six) hours as needed. For pain, Disp: , Rfl:  .  lisinopril (ZESTRIL) 10 MG tablet, take 1 tablet (10 mg) by oral route once daily for high blood pressure, Disp: , Rfl:  .  metFORMIN (GLUCOPHAGE) 500 MG tablet, take 1 tablet (500 mg) by oral route 2 times per day with morning and evening meals for 90 days, Disp: , Rfl:  .  traMADol (ULTRAM) 50 MG tablet, Take 1 tablet (50 mg total) by mouth  every 8 (eight) hours as needed for pain., Disp: 15 tablet, Rfl: 0  Social History   Tobacco Use  Smoking Status Former Smoker  . Types: Cigarettes    Allergies  Allergen Reactions  . Sulfa Antibiotics Nausea Only  . Aspirin Palpitations   Objective:   Vitals:   12/02/18 1225  BP: (!) 166/92  Pulse: 87  Temp: 97.6 F (36.4 C)   There is no height or weight on file to calculate BMI. Constitutional Well developed. Well nourished.  Vascular Dorsalis pedis pulses palpable bilaterally. Posterior tibial pulses palpable bilaterally. Capillary refill normal to all digits.  No cyanosis or clubbing noted. Pedal hair growth normal.  Neurologic Normal speech. Oriented to person, place, and time. Epicritic sensation to light touch grossly present bilaterally.  Dermatologic Nails well groomed and normal in appearance. No open wounds. Plantar lateral hallux hyperkeratoses  Orthopedic: Normal joint ROM without pain or crepitus bilaterally. No visible deformities. POP hallux lateral condyles bilat   Radiographs: None Assessment:   1. 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 Pain in toes of both feet    Plan:  Patient was evaluated and treated and all questions answered.  Calluses Bilateral Hallux -Discussed etiology, underlying bony prominence from the lateral hallux condyles, made  worse by her use of high heels. -Discussed the proper shoe gear. -Discussed conservative self care of the lesions -Toe caps dispensed bilat -We could consider shaving down the lateral condyles of the hallux however if she is not amenable to changing her shoe gear I do not know this would offer her substantial benefit  Return in about 6 weeks (around 01/13/2019) for Hammertoe f/u .

## 2019-01-13 ENCOUNTER — Ambulatory Visit: Payer: Medicare Other | Admitting: Podiatry

## 2019-08-05 ENCOUNTER — Encounter (HOSPITAL_COMMUNITY): Payer: Self-pay | Admitting: Emergency Medicine

## 2019-08-05 ENCOUNTER — Other Ambulatory Visit: Payer: Self-pay

## 2019-08-05 ENCOUNTER — Emergency Department (HOSPITAL_COMMUNITY)
Admission: EM | Admit: 2019-08-05 | Discharge: 2019-08-05 | Disposition: A | Discharge status: Home / Self Care | Payer: Medicare Other | Attending: Emergency Medicine | Admitting: Emergency Medicine

## 2019-08-05 DIAGNOSIS — Z7984 Long term (current) use of oral hypoglycemic drugs: Secondary | ICD-10-CM | POA: Diagnosis not present

## 2019-08-05 DIAGNOSIS — R519 Headache, unspecified: Secondary | ICD-10-CM | POA: Insufficient documentation

## 2019-08-05 DIAGNOSIS — Z87891 Personal history of nicotine dependence: Secondary | ICD-10-CM | POA: Insufficient documentation

## 2019-08-05 DIAGNOSIS — Z79899 Other long term (current) drug therapy: Secondary | ICD-10-CM | POA: Diagnosis not present

## 2019-08-05 DIAGNOSIS — I10 Essential (primary) hypertension: Secondary | ICD-10-CM | POA: Diagnosis not present

## 2019-08-05 LAB — BASIC METABOLIC PANEL
Anion gap: 10 (ref 5–15)
BUN: 12 mg/dL (ref 8–23)
CO2: 24 mmol/L (ref 22–32)
Calcium: 9.2 mg/dL (ref 8.9–10.3)
Chloride: 101 mmol/L (ref 98–111)
Creatinine, Ser: 0.8 mg/dL (ref 0.44–1.00)
GFR calc Af Amer: >60 mL/min (ref >60)
GFR calc non Af Amer: >60 mL/min (ref >60)
Glucose, Bld: 154 mg/dL — ABNORMAL HIGH (ref 70–99)
Potassium: 3.4 mmol/L — ABNORMAL LOW (ref 3.5–5.1)
Sodium: 135 mmol/L (ref 135–145)

## 2019-08-05 LAB — CBC WITH DIFFERENTIAL/PLATELET
Abs Immature Granulocytes: 0.03 10*3/uL (ref 0.00–0.07)
Basophils Absolute: 0 10*3/uL (ref 0.0–0.1)
Basophils Relative: 1 %
Eosinophils Absolute: 0.3 10*3/uL (ref 0.0–0.5)
Eosinophils Relative: 4 %
HCT: 36.7 % (ref 36.0–46.0)
Hemoglobin: 11.6 g/dL — ABNORMAL LOW (ref 12.0–15.0)
Immature Granulocytes: 0 %
Lymphocytes Relative: 39 %
Lymphs Abs: 2.7 10*3/uL (ref 0.7–4.0)
MCH: 27.2 pg (ref 26.0–34.0)
MCHC: 31.6 g/dL (ref 30.0–36.0)
MCV: 85.9 fL (ref 80.0–100.0)
Monocytes Absolute: 0.4 10*3/uL (ref 0.1–1.0)
Monocytes Relative: 6 %
Neutro Abs: 3.4 10*3/uL (ref 1.7–7.7)
Neutrophils Relative %: 50 %
Platelets: 256 10*3/uL (ref 150–400)
RBC: 4.27 MIL/uL (ref 3.87–5.11)
RDW: 15 % (ref 11.5–15.5)
WBC: 6.9 10*3/uL (ref 4.0–10.5)
nRBC: 0 % (ref 0.0–0.2)

## 2019-08-05 NOTE — ED Provider Notes (Signed)
[facility] DEPT Provider Note   CSN: [id number] Arrival date & time: 08/05/19  1501     History Chief Complaint  Patient presents with  . Hypertension  . Headache    Joy Moreno is a 79 y.o. female with past medical history of hypertension on lisinopril, diabetes presenting to emergency department today with chief complaint acute onset of hypertension.  She states she checks her blood pressure daily.  Today she checked it at noon and it was 192/99 and when she checked it 2 hours later it was 200/98. She has a chart at home that advises her to call her pcp or EMS if blood pressure is elevated. She reports compliance with her medications. She did not take any additional medications prior to arrival.  She denies fever, chills, diaphoresis, visual changes, neck pain, neck stiffness, chest pain, shortness of breath, palpitations, syncope, back pain, numbness, weakness, speech difficulty, light headedness, dizziness, seizures, headache. History provided by patient with additional history obtained from chart review.      Past Medical History:  Diagnosis Date  . Borderline diabetic     There are no problems to display for this patient.   Past Surgical History:  Procedure Laterality Date  . BACK SURGERY    . CESAREAN SECTION       OB History   No obstetric history on file.     No family history on file.  Social History   Tobacco Use  . Smoking status: Former Smoker    Types: Cigarettes  Substance Use Topics  . Alcohol use: No  . Drug use: No    Home Medications Prior to Admission medications   Medication Sig Start Date End Date Taking? Authorizing Provider  Accu-Chek FastClix Lancets MISC CHECK BLOOD SUGAR 3 TIMES PER DAY 06/22/18   [provider]  ACCU-CHEK GUIDE test strip use to check blood sugars three times daily 11/18/18   [provider]  atorvastatin (LIPITOR) 20 MG tablet take 1 tablet (20 mg) by oral  route once daily for high cholesterol 11/24/18   [provider]  Blood Glucose Monitoring Suppl (ACCU-CHEK GUIDE) w/Device KIT USE METER 3 TIMES DAILY TO CHECK BS 06/23/18   [provider]  cephALEXin (KEFLEX) 500 MG capsule Take 1 capsule (500 mg total) by mouth 4 (four) times daily. 03/11/16   Nona Dell, PA-C  diazepam (VALIUM) 5 MG tablet Take 1 tablet (5 mg total) by mouth every 12 (twelve) hours as needed (muscle spasm). 05/04/12   Carmin Muskrat, MD  famotidine (PEPCID) 20 MG tablet Take 1 tablet (20 mg total) by mouth 2 (two) times daily. 03/11/16   Nona Dell, PA-C  glipiZIDE (GLUCOTROL) 5 MG tablet TAKE 1 TABLET (5 MG) BY ORAL ROUTE ONCE DAILY BEFORE A MEAL FOR 30 DAYS 06/13/18   [provider]  ibuprofen (ADVIL,MOTRIN) 200 MG tablet Take 400 mg by mouth every 6 (six) hours as needed. For pain    [provider]  lisinopril (ZESTRIL) 10 MG tablet take 1 tablet (10 mg) by oral route once daily for high blood pressure 11/24/18   [provider]  metFORMIN (GLUCOPHAGE) 500 MG tablet take 1 tablet (500 mg) by oral route 2 times per day with morning and evening meals for 90 days 11/24/18   [provider]  traMADol (ULTRAM) 50 MG tablet Take 1 tablet (50 mg total) by mouth every 8 (eight) hours as needed for pain. 05/04/12   Carmin Muskrat, MD  Allergies    Sulfa antibiotics and Aspirin  Review of Systems   Review of Systems  Physical Exam Updated Vital Signs BP (!) 150/86   Pulse 76   Temp 98.3 F (36.8 C) (Oral)   Resp 20   SpO2 98%   Physical Exam Vitals and nursing note reviewed.  Constitutional:      General: She is not in acute distress.    Appearance: She is not ill-appearing.  HENT:     Head: Normocephalic and atraumatic.     Comments: No sinus or temporal tenderness.    Right Ear: Tympanic membrane and external ear normal.     Left Ear: Tympanic membrane and external ear normal.      Nose: Nose normal.     Mouth/Throat:     Mouth: Mucous membranes are moist.     Pharynx: Oropharynx is clear.  Eyes:     General: No scleral icterus.       Right eye: No discharge.        Left eye: No discharge.     Extraocular Movements: Extraocular movements intact.     Conjunctiva/sclera: Conjunctivae normal.     Pupils: Pupils are equal, round, and reactive to light.  Neck:     Vascular: No JVD.  Cardiovascular:     Rate and Rhythm: Normal rate and regular rhythm.     Pulses: Normal pulses.          Radial pulses are 2+ on the right side and 2+ on the left side.     Heart sounds: Normal heart sounds.  Pulmonary:     Comments: Lungs clear to auscultation in all fields. Symmetric chest rise. No wheezing, rales, or rhonchi. Abdominal:     Comments: Abdomen is soft, non-distended, and non-tender in all quadrants. No rigidity, no guarding. No peritoneal signs.  Musculoskeletal:        General: Normal range of motion.     Cervical back: Normal range of motion.  Skin:    General: Skin is warm and dry.     Capillary Refill: Capillary refill takes less than 2 seconds.  Neurological:     Mental Status: She is oriented to person, place, and time.     GCS: GCS eye subscore is 4. GCS verbal subscore is 5. GCS motor subscore is 6.     Comments: Mental Status:  Alert, oriented, thought content appropriate, able to give a coherent history. Speech fluent without evidence of aphasia. Able to follow 2 step commands without difficulty.  Cranial Nerves:  II:  Peripheral visual fields grossly normal, pupils equal, round, reactive to light III,IV, VI: ptosis not present, extra-ocular motions intact bilaterally  V,VII: smile symmetric, facial light touch sensation equal VIII: hearing grossly normal to voice  X: uvula elevates symmetrically  XI: bilateral shoulder shrug symmetric and strong XII: midline tongue extension without fassiculations Motor:  Normal tone. 5/5 in upper and lower  extremities bilaterally including strong and equal grip strength and dorsiflexion/plantar flexion Sensory: Pinprick and light touch normal in all extremities.  Deep Tendon Reflexes: 2+ and symmetric in the biceps and patella Cerebellar: normal finger-to-nose with bilateral upper extremities Gait: normal gait and balance CV: distal pulses palpable throughout     Psychiatric:        Behavior: Behavior normal.     ED Results / Procedures / Treatments   Labs (all labs ordered are listed, but only abnormal results are displayed) Labs Reviewed  BASIC METABOLIC PANEL - Abnormal; Notable  for the following components:      Result Value   Potassium 3.4 (*)    Glucose, Bld 154 (*)    All other components within normal limits  CBC WITH DIFFERENTIAL/PLATELET - Abnormal; Notable for the following components:   Hemoglobin 11.6 (*)    All other components within normal limits    EKG None  Radiology No results found.  Procedures Procedures (including critical care time)  Medications Ordered in ED Medications - No data to display  ED Course  I have reviewed the triage vital signs and the nursing notes.  Pertinent labs & imaging results that were available during my care of the patient were reviewed by me and considered in my medical decision making (see chart for details).    MDM Rules/Calculators/A&P                      Patient seen and examined. Patient presents awake, alert, hemodynamically stable, afebrile, non toxic.   She is very well-appearing.  Blood pressure on arrival was 150/86.  Neuro exam is normal.  The triage notes that she reported a headache.  When I asked patient about a headache, she denies to me having 1 today. I asked several times in different ways if she had a headache ad she always answers no. I rechecked her blood pressure and it was 160/88.  Basic labs checked and there were no signs of endorgan damage.  Hemoglobin appears consistent with baseline. No signs  of hypertensive urgency and emergency.  The patient appears reasonably screened and/or stabilized for discharge and I doubt any other medical condition or other [facility] requiring further screening, evaluation, or treatment in the ED at this time prior to discharge. The patient is safe for discharge with strict return precautions discussed. Recommend she continue to monitor blood pressures at home and follow up with pcp within 1 week to discuss readings for possible medication adjustment. No changes made to medications today. Findings and plan of care discussed with supervising physician Dr. Ronnald Nian.   Portions of this note were generated with [employer]. Dictation errors may occur despite best attempts at proofreading.   Final Clinical Impression(s) / ED Diagnoses Final diagnoses:  Asymptomatic hypertension    Rx / DC Orders ED Discharge Orders    None       Cherre Robins, PA-C 08/06/19 1118    [place], Adam, DO 08/06/19 1156

## 2019-08-05 NOTE — ED Triage Notes (Addendum)
Per EMS, patient from home, c/o headache since noon and hypertensive when checking BP at home. 174/100 with EMS. Denies chest pain, SOB, blurred vision. Reports taking lisinopril as prescribed. States headache has resolved at this time.

## 2019-08-05 NOTE — Discharge Instructions (Signed)
You have been seen today for high blood pressure. Please read and follow all provided instructions. Return to the emergency room for worsening condition or new concerning symptoms eluding headache, blurry vision, numbness, weakness, tingling, change in mental status, difficulty speaking.  Please follow-up with your doctor to discuss your blood pressure.  If it continues to be elevated you might need to discuss further medications with your primary care doctor.  1. Medications: Continue usual home medications Take medications as prescribed. Please review all of the medicines and only take them if you do not have an allergy to them.   2. Treatment: rest, drink plenty of fluids  3. Follow Up:  Please follow up with primary care provider by scheduling an appointment as soon as possible for a visit  You should have your blood pressure   It is also a possibility that you have an allergic reaction to any of the medicines that you have been prescribed - Everybody reacts differently to medications and while MOST people have no trouble with most medicines, you may have a reaction such as nausea, vomiting, rash, swelling, shortness of breath. If this is the case, please stop taking the medicine immediately and contact your physician.  ?

## 2020-04-24 ENCOUNTER — Emergency Department (HOSPITAL_COMMUNITY): Payer: Medicare Other

## 2020-04-24 ENCOUNTER — Other Ambulatory Visit: Payer: Self-pay

## 2020-04-24 ENCOUNTER — Emergency Department (HOSPITAL_COMMUNITY)
Admission: EM | Admit: 2020-04-24 | Discharge: 2020-04-24 | Disposition: A | Discharge status: Home / Self Care | Payer: Medicare Other | Attending: Emergency Medicine | Admitting: Emergency Medicine

## 2020-04-24 ENCOUNTER — Encounter (HOSPITAL_COMMUNITY): Payer: Self-pay | Admitting: Emergency Medicine

## 2020-04-24 DIAGNOSIS — S56912A Strain of unspecified muscles, fascia and tendons at forearm level, left arm, initial encounter: Secondary | ICD-10-CM | POA: Diagnosis not present

## 2020-04-24 DIAGNOSIS — S59902A Unspecified injury of left elbow, initial encounter: Secondary | ICD-10-CM | POA: Diagnosis present

## 2020-04-24 DIAGNOSIS — Z87891 Personal history of nicotine dependence: Secondary | ICD-10-CM | POA: Diagnosis not present

## 2020-04-24 DIAGNOSIS — R7303 Prediabetes: Secondary | ICD-10-CM | POA: Diagnosis not present

## 2020-04-24 DIAGNOSIS — Z7984 Long term (current) use of oral hypoglycemic drugs: Secondary | ICD-10-CM | POA: Insufficient documentation

## 2020-04-24 DIAGNOSIS — X500XXA Overexertion from strenuous movement or load, initial encounter: Secondary | ICD-10-CM | POA: Insufficient documentation

## 2020-04-24 DIAGNOSIS — Y9289 Other specified places as the place of occurrence of the external cause: Secondary | ICD-10-CM | POA: Diagnosis not present

## 2020-04-24 DIAGNOSIS — Y93F2 Activity, caregiving, lifting: Secondary | ICD-10-CM | POA: Insufficient documentation

## 2020-04-24 DIAGNOSIS — T148XXA Other injury of unspecified body region, initial encounter: Secondary | ICD-10-CM

## 2020-04-24 MED ORDER — HYDROCODONE-ACETAMINOPHEN 5-325 MG PO TABS
1.0000 | ORAL_TABLET | Freq: Once | ORAL | Status: AC
  Administered 2020-04-24: 1 via ORAL
  Filled 2020-04-24: qty 1

## 2020-04-24 NOTE — Discharge Instructions (Addendum)
Please read and follow all provided instructions.  You have been seen today for elbow pain, as we discussed I think you have a muscle strain.   Tests performed today include: An x-ray of the affected area - does NOT show any broken bones or dislocations.  It does show some degenerative changes. Vital signs. See below for your results today.   Home care instructions: -- *RICE in the first 24-48 hours after injury:  Rest Ice- Do not apply ice pack directly to your skin, place towel or similar between your skin and ice/ice pack. Apply ice for 20 min, then remove for 40 min while awake Compression- Wear brace, elastic bandage, splint as directed by your provider Elevate affected extremity above the level of your heart when not walking around for the first 24-48 hours   Use Ibuprofen (Motrin/Advil) 600mg  every 6 hours as needed for pain (do not exceed max dose in 24 hours, 2400mg )  Follow-up instructions: Please follow-up with your primary care provider or the provided orthopedic physician (bone specialist) if you continue to have significant pain in 1 week. In this case you may have a more severe injury that requires further care.    Additional Information:  Your vital signs today were: BP (!) 188/93 (BP Location: Right Arm)   Pulse 93   Temp 97.6 F (36.4 C) (Oral)   Resp 18   Ht 5\' 7"  (1.702 m)   Wt 77.1 kg   SpO2 100%   BMI 26.63 kg/m    Your blood pressure was elevated today, please have this rechecked by your PCP in the next couple of days.  If you have any chest pain, shortness of breath, severe headache please come back to the emergency department.

## 2020-04-24 NOTE — ED Provider Notes (Signed)
[facility] EMERGENCY DEPARTMENT Provider Note   CSN: [id number] Arrival date & time: 04/24/20  1010     History Chief Complaint  Patient presents with  . Elbow Pain    Joy Moreno is a 79 y.o. female with no prior past medical history that presents emerge department today for left elbow pain. Patient states that she started having left elbow pain after she lifted a heavy console and pulled it away from a wall, states that pain started shortly after. Denies hearing any popping sensation. States that pain has been gradually getting better, however removed it last night and it started getting worse. Has been constant throughout the morning. States that pain sometimes shoots down into her fingers. No chest pain, shoulder pain, jaw pain, numbness, tingling. States that she tried 2.45m of oxycodone without much relief, denies any other trauma to this area. States that she is generally healthy. Denies any nausea, vomiting, diaphoresis.  HPI     Past Medical History:  Diagnosis Date  . Borderline diabetic     There are no problems to display for this patient.   Past Surgical History:  Procedure Laterality Date  . BACK SURGERY    . CESAREAN SECTION       OB History   No obstetric history on file.     No family history on file.  Social History   Tobacco Use  . Smoking status: Former Smoker    Types: Cigarettes  Substance Use Topics  . Alcohol use: No  . Drug use: No    Home Medications Prior to Admission medications   Medication Sig Start Date End Date Taking? Authorizing Provider  Accu-Chek FastClix Lancets MISC CHECK BLOOD SUGAR 3 TIMES PER DAY 06/22/18   [provider]  ACCU-CHEK GUIDE test strip use to check blood sugars three times daily 11/18/18   [provider]  atorvastatin (LIPITOR) 20 MG tablet take 1 tablet (20 mg) by oral route once daily for high cholesterol 11/24/18   [provider]  Blood Glucose  Monitoring Suppl (ACCU-CHEK GUIDE) w/Device KIT USE METER 3 TIMES DAILY TO CHECK BS 06/23/18   [provider]  cephALEXin (KEFLEX) 500 MG capsule Take 1 capsule (500 mg total) by mouth 4 (four) times daily. 03/11/16   NNona Dell PA-C  diazepam (VALIUM) 5 MG tablet Take 1 tablet (5 mg total) by mouth every 12 (twelve) hours as needed (muscle spasm). 05/04/12   LCarmin Muskrat MD  famotidine (PEPCID) 20 MG tablet Take 1 tablet (20 mg total) by mouth 2 (two) times daily. 03/11/16   NNona Dell PA-C  glipiZIDE (GLUCOTROL) 5 MG tablet TAKE 1 TABLET (5 MG) BY ORAL ROUTE ONCE DAILY BEFORE A MEAL FOR 30 DAYS 06/13/18   [provider]  ibuprofen (ADVIL,MOTRIN) 200 MG tablet Take 400 mg by mouth every 6 (six) hours as needed. For pain    [provider]  lisinopril (ZESTRIL) 10 MG tablet take 1 tablet (10 mg) by oral route once daily for high blood pressure 11/24/18   [provider]  metFORMIN (GLUCOPHAGE) 500 MG tablet take 1 tablet (500 mg) by oral route 2 times per day with morning and evening meals for 90 days 11/24/18   [provider]  traMADol (ULTRAM) 50 MG tablet Take 1 tablet (50 mg total) by mouth every 8 (eight) hours as needed for pain. 05/04/12   LCarmin Muskrat MD    Allergies    Sulfa antibiotics and Aspirin  Review of Systems   Review of Systems  Constitutional: Negative for diaphoresis, fatigue and fever.  Eyes: Negative for visual disturbance.  Respiratory: Negative for shortness of breath.   Gastrointestinal: Negative for nausea and vomiting.  Musculoskeletal: Positive for arthralgias. Negative for back pain and myalgias.  Skin: Negative for color change, pallor, rash and wound.  Neurological: Negative for syncope, weakness, light-headedness, numbness and headaches.  Psychiatric/Behavioral: Negative for behavioral problems and confusion.    Physical Exam Updated Vital Signs BP (!) 188/93 (BP Location:  Right Arm)   Pulse 93   Temp 97.6 F (36.4 C) (Oral)   Resp 18   Ht 5' 7"  (1.702 m)   Wt 77.1 kg   SpO2 100%   BMI 26.63 kg/m   Physical Exam Constitutional:      General: She is not in acute distress.    Appearance: Normal appearance. She is not ill-appearing, toxic-appearing or diaphoretic.  Cardiovascular:     Rate and Rhythm: Normal rate and regular rhythm.     Pulses: Normal pulses.  Pulmonary:     Effort: Pulmonary effort is normal. No respiratory distress.     Breath sounds: Normal breath sounds.  Musculoskeletal:        General: Normal range of motion.       Arms:     Cervical back: Normal range of motion. No tenderness.     Comments: Patient with tenderness to brachioradialis muscle, does cross lateral epicondyle. No erythema or warmth to this area. No tenderness to olecranon. Patient with normal range of motion to the shoulder, elbow, wrist and fingers. Normal sensation. Cap refill less than 2 seconds. Radial pulse 2+.  Skin:    General: Skin is warm and dry.     Capillary Refill: Capillary refill takes less than 2 seconds.  Neurological:     General: No focal deficit present.     Mental Status: She is alert and oriented to person, place, and time.  Psychiatric:        Mood and Affect: Mood normal.        Behavior: Behavior normal.        Thought Content: Thought content normal.     ED Results / Procedures / Treatments   Labs (all labs ordered are listed, but only abnormal results are displayed) Labs Reviewed - No data to display  EKG None  Radiology DG Elbow Complete Left  Result Date: 04/24/2020 CLINICAL DATA:  Elbow pain. EXAM: LEFT ELBOW - COMPLETE 3+ VIEW COMPARISON:  No prior. FINDINGS: No acute bony abnormality identified. No evidence of fracture or dislocation. Mild degenerative change. No evidence of effusion. IMPRESSION: Mild degenerative change.  No acute abnormality. Electronically Signed   By: Marcello Moores  Register   On: 04/24/2020 11:12     Procedures Procedures (including critical care time)  Medications Ordered in ED Medications  HYDROcodone-acetaminophen (NORCO/VICODIN) 5-325 MG per tablet 1 tablet (has no administration in time range)    ED Course  I have reviewed the triage vital signs and the nursing notes.  Pertinent labs & imaging results that were available during my care of the patient were reviewed by me and considered in my medical decision making (see chart for details).    MDM Rules/Calculators/A&P                          Yani Yagi is a 79 y.o. female with no prior past medical history that presents emerge department today for  left elbow pain after injury, will obtain x-rays and pain control. Do not suspect bony injury, it appears that patient most likely has muscle strain to brachioradialis, however patient does want imaging at this time.  Plain films today negative, pain has been controlled. Will have patient follow-up with PCP and symptomatic treatment discussed.  Blood pressure today elevated, most likely from pain, did discuss that patient needs to have this reevaluated by PCP. Patient denies headache, change in vision, numbness, weakness, chest pain, dyspnea, dizziness, or lightheadedness therefore doubt hypertensive emergency. Discussed return precaution signs/symptoms for hypertensive emergency as listed above with the patient. He/she confirmed understanding.    Doubt need for further emergent work up at this time. I explained the diagnosis and have given explicit precautions to return to the ER including for any other new or worsening symptoms. The patient understands and accepts the medical plan as it's been dictated and I have answered their questions. Discharge instructions concerning home care and prescriptions have been given. The patient is STABLE and is discharged to home in good condition.   final Clinical Impression(s) / ED Diagnoses Final diagnoses:  Muscle strain    Rx / DC  Orders ED Discharge Orders    None       Alfredia Client, PA-C 04/24/20 [place], [place], DO 04/24/20 1536

## 2020-04-24 NOTE — ED Triage Notes (Signed)
C/o L elbow pain since 10/14.  Pain started after moving console TV away from wall.

## 2021-07-27 IMAGING — DX DG ELBOW COMPLETE 3+V*L*
4 series · 4 of 4 positions shown · non-contrast
Comparison: No prior.

CLINICAL DATA: Elbow pain.

EXAM:
LEFT ELBOW - COMPLETE 3+ VIEW

[elbow ap]
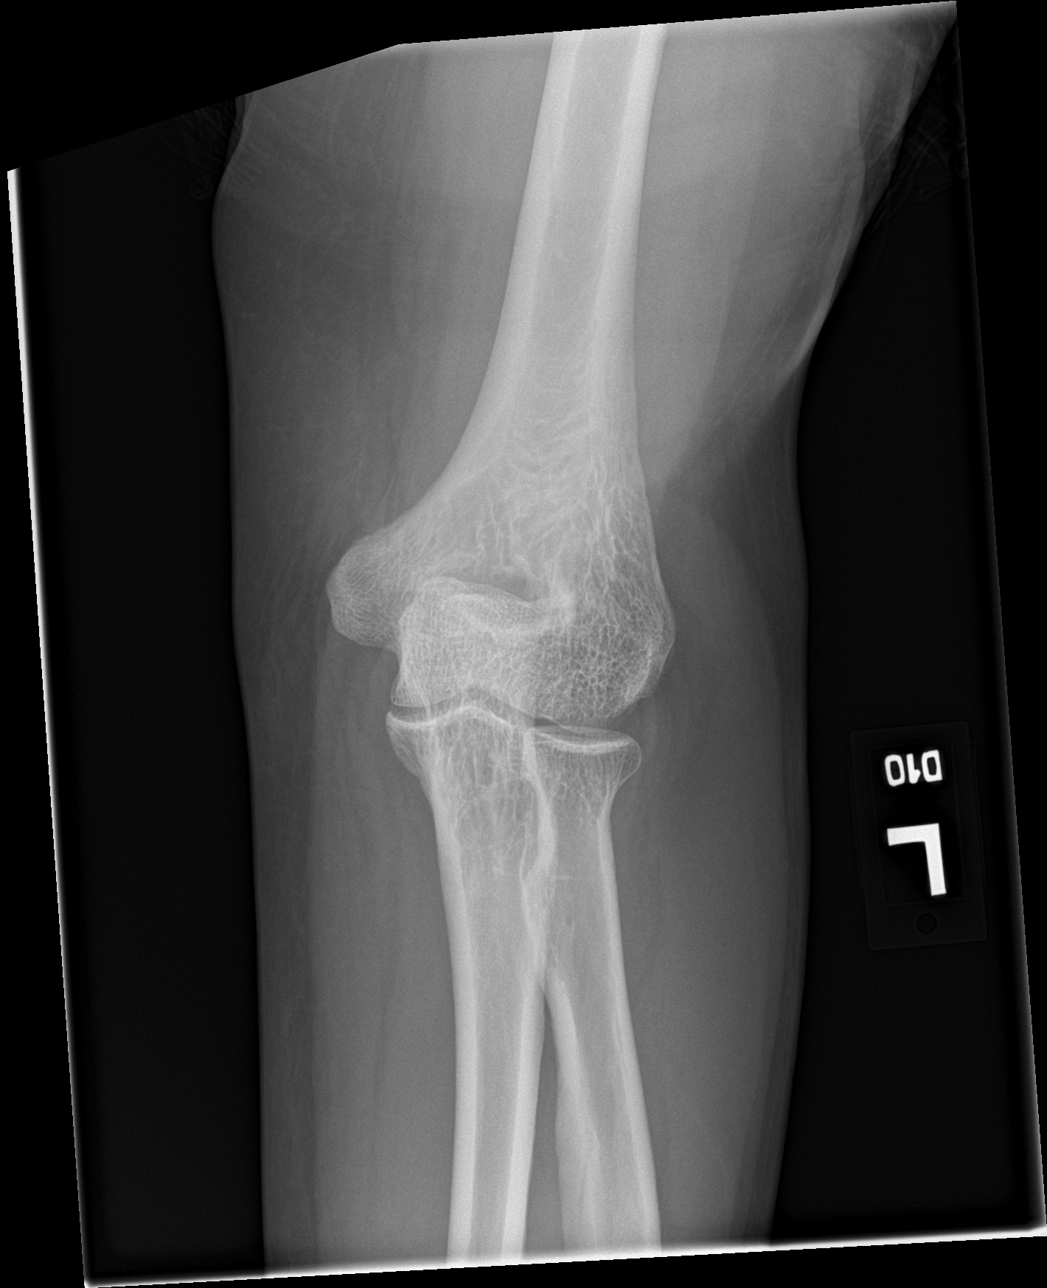

[elbow obl (1 of 2)]
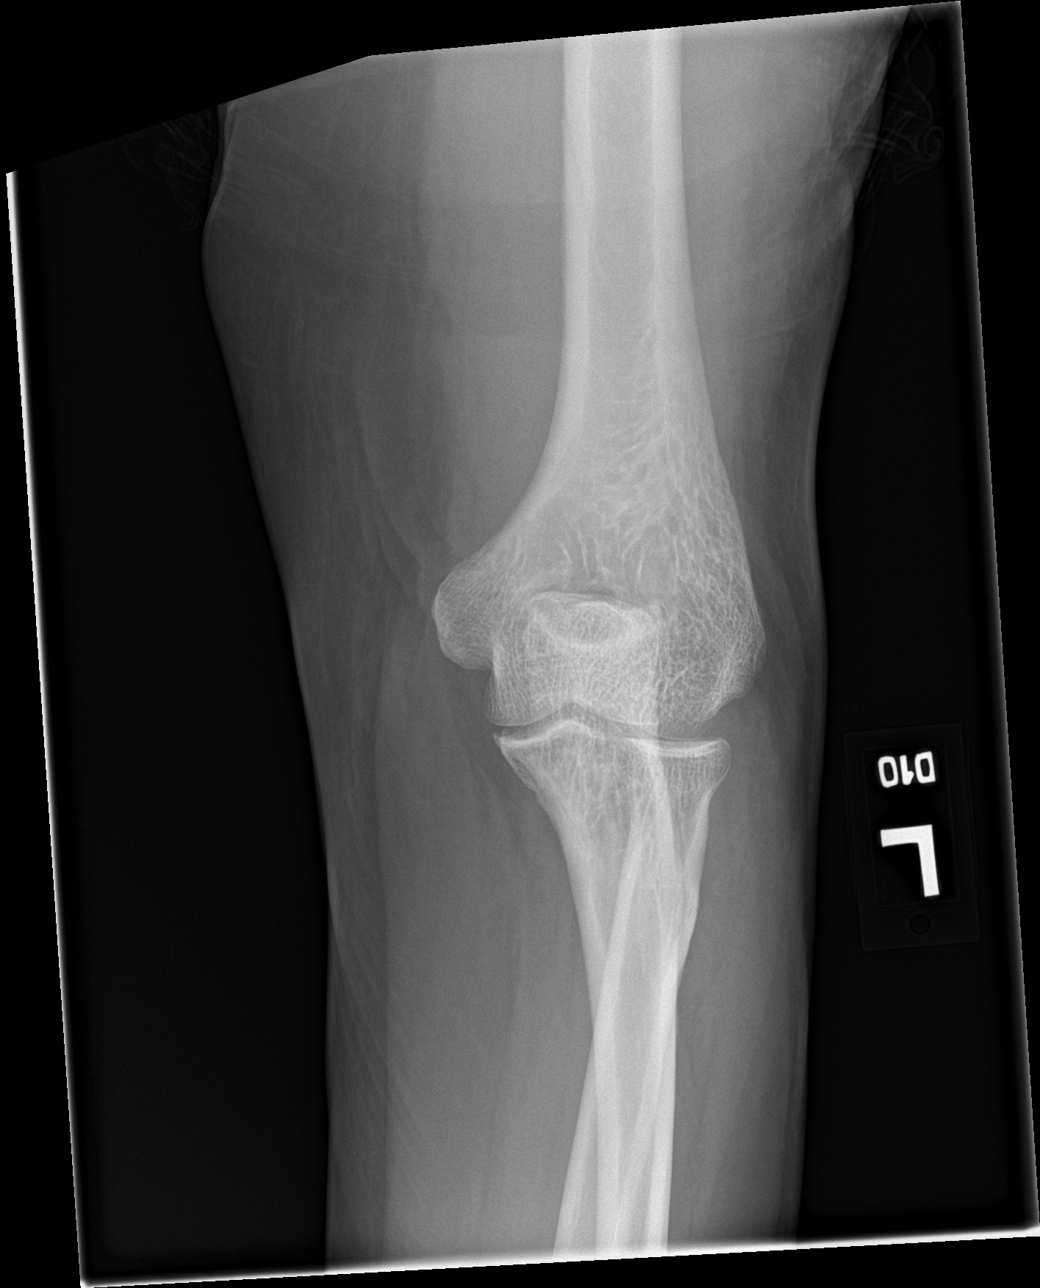

[elbow obl (2 of 2)]
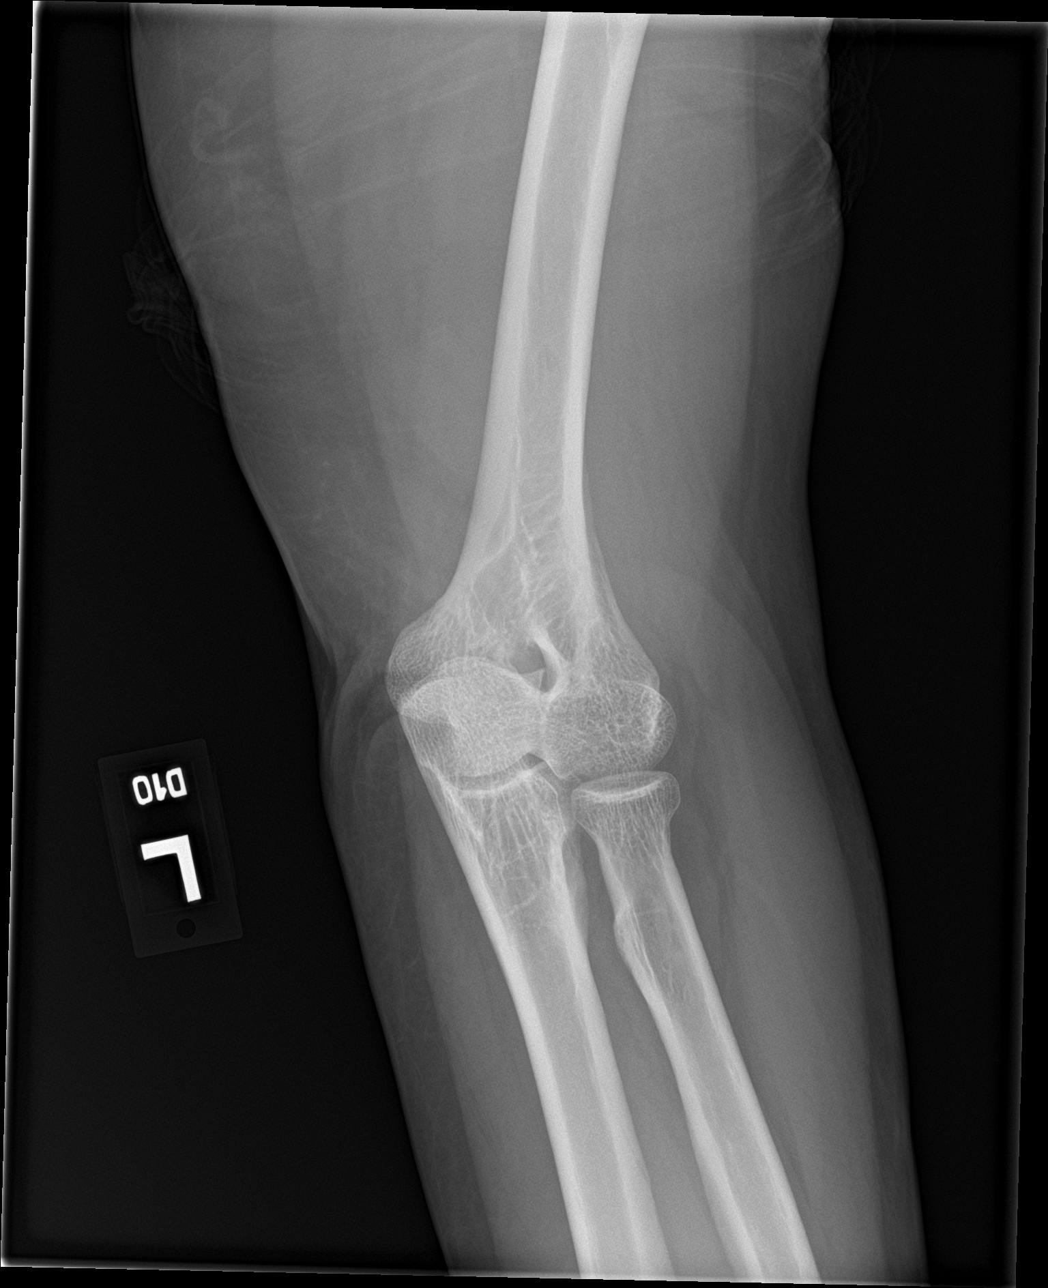

[elbow lat]
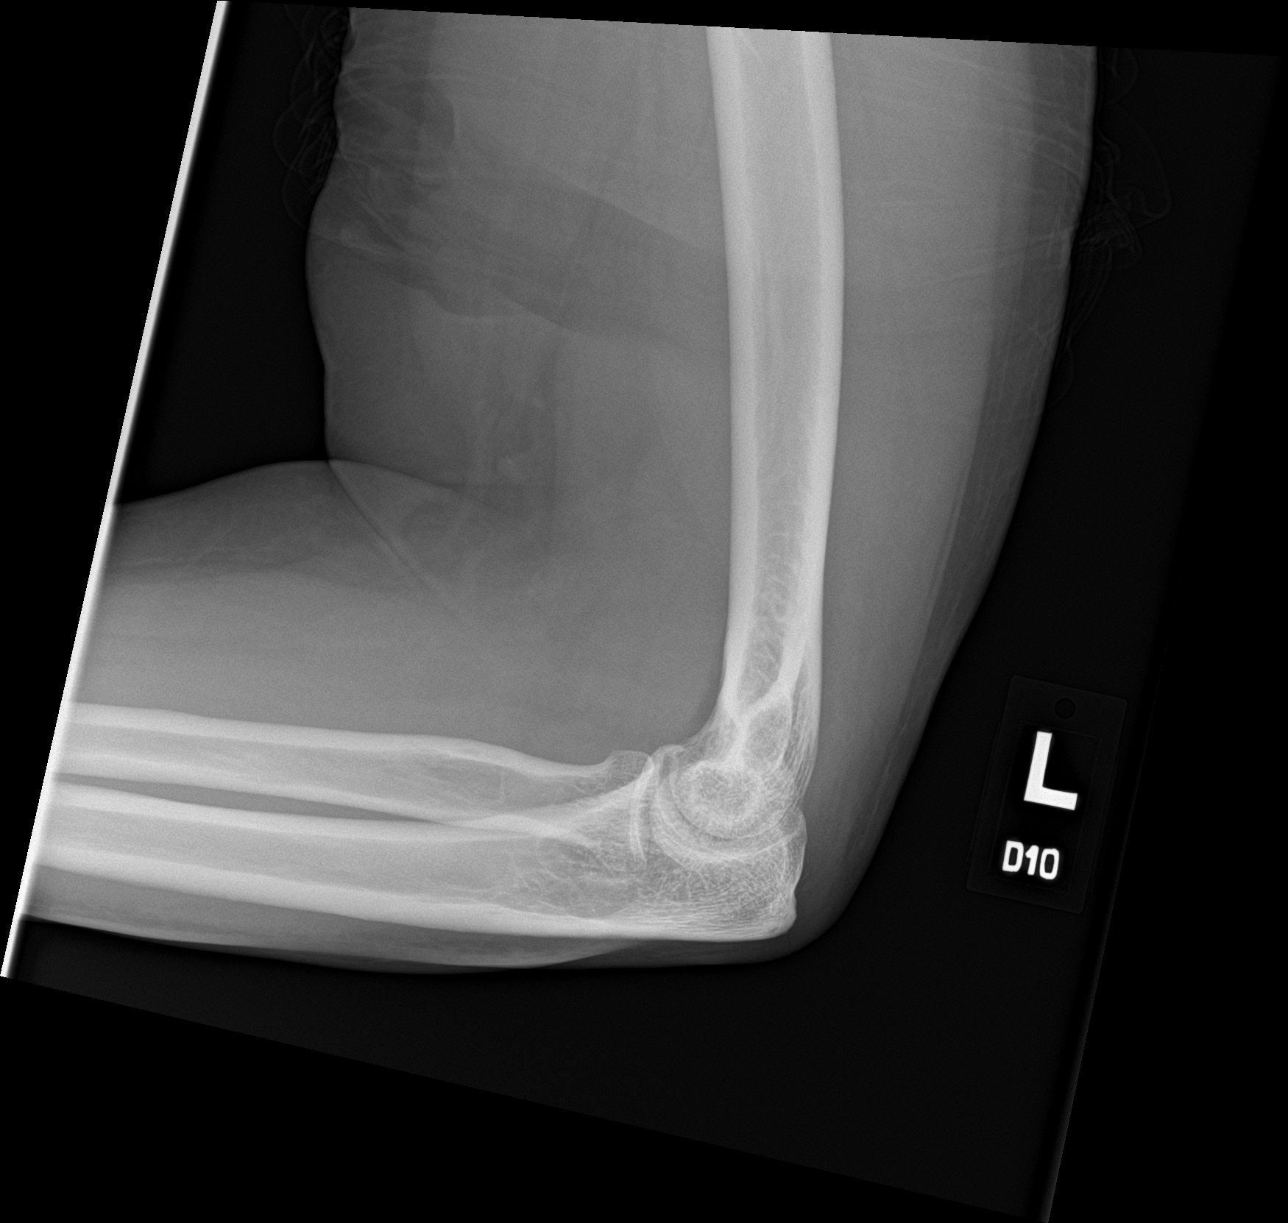

[4 of 4 positions shown; findings below may reference images not displayed]

FINDINGS: No acute bony abnormality identified. No evidence of fracture or
dislocation. Mild degenerative change. No evidence of effusion.
IMPRESSION: Mild degenerative change.  No acute abnormality.

## 2022-01-23 ENCOUNTER — Other Ambulatory Visit: Payer: Self-pay | Admitting: Family

## 2022-01-23 DIAGNOSIS — E2839 Other primary ovarian failure: Secondary | ICD-10-CM

## 2022-07-03 ENCOUNTER — Other Ambulatory Visit: Payer: Medicare Other

## 2023-12-17 NOTE — Patient Instructions (Signed)
 Patient Education Table of Contents Furosemide  Tablets  To view videos and all your education online visit, [email or web address].elsevier.[email or web address] or scan this QR code with your smartphone. Access to this content will expire in one year. Furosemide  Tablets What is this medication? FUROSEMIDE  (fyoor [place] se mide) treats high blood pressure. It may also be used to reduce swelling related to heart, kidney, or liver disease. It helps your kidneys remove more fluid and salt from your blood through the urine. It belongs to a group of medications called diuretics. This medicine may be used for other purposes; ask your health care provider or pharmacist if you have questions. COMMON BRAND NAME(S): Active-Medicated Specimen Kit, Delone, Diuscreen, Lasix , RX Specimen Collection Kit, Specimen Collection Kit, URINX Medicated Specimen Collection What should I tell my care team before I take this medication? They need to know if you have any of these conditions: Diarrhea or vomiting Gout Heart disease High or low levels of electrolytes, such as magnesium, potassium, or sodium in your blood Kidney disease, small amounts of urine, or difficulty passing urine Liver disease Thyroid disease An unusual or allergic reaction to furosemide , sulfa medications, other medications, foods, dyes, or preservatives Pregnant or trying to get pregnant Breast-feeding How should I use this medication? Take this medication by mouth. Take it as directed on the prescription label at the same time every day. You can take it with or without food. If it upsets your stomach, take it with food. Keep taking it unless your care team tells you to stop. Talk to your care team about the use of this medication in children. Special care may be needed. Overdosage: If you think you have taken too much of this medicine contact a poison control center or emergency room at once. NOTE: This medicine is only for you. Do not share this medicine with  others. What if I miss a dose? If you miss a dose, take it as soon as you can. If it is almost time for your next dose, take only that dose. Do not take double or extra doses. What may interact with this medication? Aspirin and aspirin-like medications Certain antibiotics Chloral hydrate Cisplatin Cyclosporine Digoxin Diuretics Laxatives Lithium Medications for blood pressure Medications that relax muscles for surgery Methotrexate NSAIDs, medications for pain and inflammation, such as ibuprofen , naproxen, or indomethacin Phenytoin Steroid medications, such as prednisone or cortisone Sucralfate Thyroid hormones This list may not describe all possible interactions. Give your health care provider a list of all the medicines, herbs, non-prescription drugs, or dietary supplements you use. Also tell them if you smoke, drink alcohol, or use illegal drugs. Some items may interact with your medicine. What should I watch for while using this medication? Visit your care team for regular checks on your progress. Tell your care team if your symptoms do not start to get better or if they get worse. Check your blood pressure as directed. Know what your blood pressure should be and when to contact your care team. This medication may increase the amount of sugar in blood or urine. The risk may be higher in patients who already have diabetes. Ask your care team what you can do to lower your risk of diabetes while taking this medication. You may need to be on a special diet while taking this medication. Check with your care team. Also, ask how many glasses of fluid you need to drink a day. You must not get dehydrated. This medication may affect your coordination, reaction time, or judgment. Do not  drive or operate machinery until you know how this medication affects you. Sit up or stand slowly to reduce the risk of dizzy or fainting spells. Drinking alcohol with this medication can increase the risk of these  side effects. This medication can make you more sensitive to the sun. Keep out of the sun. If you cannot avoid being in the sun, wear protective clothing and use sunscreen. Do not use sun lamps or tanning beds/booths. Check with your care team if you have severe diarrhea, nausea, and vomiting, or if you sweat a lot. The loss of too much body fluid may make it dangerous for you to take this medication. What side effects may I notice from receiving this medication? Side effects that you should report to your care team as soon as possible: Allergic reactions--skin rash, itching, hives, swelling of the face, lips, tongue, or throat Dehydration--increased thirst, dry mouth, feeling faint or lightheaded, headache, dark yellow or brown urine Hearing loss, ringing in ears High blood sugar (hyperglycemia)--increased thirst or amount of urine, unusual weakness or fatigue, blurry vision Low blood pressure--dizziness, feeling faint or lightheaded, blurry vision Low potassium level--muscle pain or cramps, unusual weakness or fatigue, fast or irregular heartbeat, constipation Side effects that usually do not require medical attention (report to your care team if they continue or are bothersome): Burning or tingling sensation in hands or feet Constipation Diarrhea Dizziness Headache This list may not describe all possible side effects. Call your doctor for medical advice about side effects. You may report side effects to FDA at 1-800-FDA-1088. Where should I keep my medication? Keep out of the reach of children and pets. Store at room temperature between 20 and 25 degrees C (68 and 77 degrees F). Protect from light and moisture. Keep the container tightly closed. Throw away any unused medication after the expiration date. NOTE: This sheet is a summary. It may not cover all possible information. If you have questions about this medicine, talk to your doctor, pharmacist, or health care provider.  2025  Elsevier/Gold Standard (2021-05-12)

## 2024-03-14 ENCOUNTER — Ambulatory Visit: Admitting: Family Medicine

## 2024-03-16 ENCOUNTER — Encounter: Payer: Self-pay | Admitting: Family Medicine

## 2024-03-16 ENCOUNTER — Ambulatory Visit (INDEPENDENT_AMBULATORY_CARE_PROVIDER_SITE_OTHER): Admitting: Family Medicine

## 2024-03-16 VITALS — BP 140/88 | HR 63 | Ht 68.0 in | Wt 158.2 lb

## 2024-03-16 DIAGNOSIS — I2782 Chronic pulmonary embolism: Secondary | ICD-10-CM | POA: Insufficient documentation

## 2024-03-16 DIAGNOSIS — I130 Hypertensive heart and chronic kidney disease with heart failure and stage 1 through stage 4 chronic kidney disease, or unspecified chronic kidney disease: Secondary | ICD-10-CM

## 2024-03-16 DIAGNOSIS — I5020 Unspecified systolic (congestive) heart failure: Secondary | ICD-10-CM | POA: Diagnosis not present

## 2024-03-16 DIAGNOSIS — E118 Type 2 diabetes mellitus with unspecified complications: Secondary | ICD-10-CM | POA: Insufficient documentation

## 2024-03-16 DIAGNOSIS — N1832 Chronic kidney disease, stage 3b: Secondary | ICD-10-CM

## 2024-03-16 DIAGNOSIS — K279 Peptic ulcer, site unspecified, unspecified as acute or chronic, without hemorrhage or perforation: Secondary | ICD-10-CM

## 2024-03-16 LAB — COMPREHENSIVE METABOLIC PANEL WITH GFR
ALT: 7 IU/L (ref 0–32)
AST: 16 IU/L (ref 0–40)
Albumin: 4 g/dL (ref 3.7–4.7)
Alkaline Phosphatase: 90 IU/L (ref 44–121)
BUN/Creatinine Ratio: 15 (ref 12–28)
BUN: 31 mg/dL — ABNORMAL HIGH (ref 8–27)
Bilirubin Total: 0.5 mg/dL (ref 0.0–1.2)
CO2: 20 mmol/L (ref 20–29)
Calcium: 9.4 mg/dL (ref 8.7–10.3)
Chloride: 100 mmol/L (ref 96–106)
Creatinine, Ser: 2.12 mg/dL — ABNORMAL HIGH (ref 0.57–1.00)
Globulin, Total: 3.4 g/dL (ref 1.5–4.5)
Glucose: 123 mg/dL — ABNORMAL HIGH (ref 70–99)
Potassium: 3.5 mmol/L (ref 3.5–5.2)
Sodium: 138 mmol/L (ref 134–144)
Total Protein: 7.4 g/dL (ref 6.0–8.5)
eGFR: 23 mL/min/1.73 — ABNORMAL LOW (ref >59)

## 2024-03-16 LAB — CBC WITH DIFFERENTIAL/PLATELET
Basophils Absolute: 0 x10E3/uL (ref 0.0–0.2)
Basos: 1 %
EOS (ABSOLUTE): 0.1 x10E3/uL (ref 0.0–0.4)
Eos: 2 %
Hematocrit: 30.4 % — ABNORMAL LOW (ref 34.0–46.6)
Hemoglobin: 9 g/dL — ABNORMAL LOW (ref 11.1–15.9)
Immature Grans (Abs): 0 x10E3/uL (ref 0.0–0.1)
Immature Granulocytes: 0 %
Lymphocytes Absolute: 3.3 x10E3/uL — ABNORMAL HIGH (ref 0.7–3.1)
Lymphs: 41 %
MCH: 26.4 pg — ABNORMAL LOW (ref 26.6–33.0)
MCHC: 29.6 g/dL — ABNORMAL LOW (ref 31.5–35.7)
MCV: 89 fL (ref 79–97)
Monocytes Absolute: 0.5 x10E3/uL (ref 0.1–0.9)
Monocytes: 6 %
Neutrophils Absolute: 4.1 x10E3/uL (ref 1.4–7.0)
Neutrophils: 50 %
Platelets: 207 x10E3/uL (ref 150–450)
RBC: 3.41 x10E6/uL — ABNORMAL LOW (ref 3.77–5.28)
RDW: 17.4 % — ABNORMAL HIGH (ref 11.7–15.4)
WBC: 8.1 x10E3/uL (ref 3.4–10.8)

## 2024-03-16 LAB — POCT GLYCOSYLATED HEMOGLOBIN (HGB A1C): Hemoglobin A1C: 7.2 % — AB (ref 4.0–5.6)

## 2024-03-16 MED ORDER — HYDRALAZINE HCL 25 MG PO TABS: 25.0000 mg | ORAL_TABLET | Freq: Three times a day (TID) | ORAL | 1 refills | Status: DC

## 2024-03-16 MED ORDER — VITAMIN D (ERGOCALCIFEROL) 1.25 MG (50000 UNIT) PO CAPS: 50000.0000 [IU] | ORAL_CAPSULE | ORAL | 1 refills | Status: DC

## 2024-03-16 MED ORDER — FUROSEMIDE 40 MG PO TABS: 40.0000 mg | ORAL_TABLET | Freq: Two times a day (BID) | ORAL | 2 refills | Status: DC

## 2024-03-16 MED ORDER — ALLOPURINOL 100 MG PO TABS
100.0000 mg | ORAL_TABLET | Freq: Every day | ORAL | 0 refills | Status: AC
Start: 2024-03-16 — End: ?

## 2024-03-16 MED ORDER — ELIQUIS 5 MG PO TABS: 5.0000 mg | ORAL_TABLET | Freq: Two times a day (BID) | ORAL | 1 refills | Status: DC

## 2024-03-16 MED ORDER — METOPROLOL SUCCINATE ER 25 MG PO TB24: 25.0000 mg | ORAL_TABLET | Freq: Every day | ORAL | 1 refills | Status: DC

## 2024-03-16 MED ORDER — PANTOPRAZOLE SODIUM 20 MG PO TBEC: 40.0000 mg | DELAYED_RELEASE_TABLET | Freq: Two times a day (BID) | ORAL | 2 refills | Status: DC

## 2024-03-16 MED ORDER — ATORVASTATIN CALCIUM 80 MG PO TABS: 80.0000 mg | ORAL_TABLET | Freq: Every day | ORAL | 1 refills | Status: DC

## 2024-03-16 NOTE — Patient Instructions (Signed)
 VISIT SUMMARY:  Today, we reviewed your medications and discussed your ongoing health concerns, including your heart issues, kidney disease, and recent hospitalization for a pulmonary embolism. We also addressed your diabetes management, stomach ulcer, and the need for follow-up with specialists.  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 We will refer you to a heart failure specialist and a kidney specialist to optimize your treatment. We will stop the bumetanide and order blood work to check your kidney function. Please continue to monitor your blood pressure at home in the morning and evening.  -CHRONIC PULMONARY EMBOLISM: A pulmonary embolism is a blood clot in the lungs that can cause serious breathing problems and heart injury. You should continue taking Eliquis  twice daily to prevent further clots.  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 You should continue taking allopurinol  to manage your uric acid levels. We may adjust the dose based on your upcoming blood work results.  -TYPE 2 DIABETES MELLITUS: You have type 2 diabetes, but we are not currently prescribing diabetes medications due to concerns about your kidney health. Our focus will be on managing your heart and kidney conditions.  -GASTRIC ULCER: A gastric ulcer is a sore on the lining of your stomach. Although it is not currently bleeding, you will need to follow up with a gastroenterologist for further management.  INSTRUCTIONS:  Please follow up with the heart failure specialist and nephrologist as soon as possible. Continue monitoring your blood pressure at home in the morning and evening. We will order blood work to assess your kidney function, and you should continue taking  your medications as prescribed. Additionally, schedule an appointment with a gastroenterologist for your stomach ulcer.

## 2024-03-16 NOTE — Progress Notes (Signed)
 Name: Joy Moreno   Date of Visit: 03/16/24   Date of last visit with me: Visit date not found   CHIEF COMPLAINT:  Chief Complaint  Patient presents with   Establish Care    New patient. Having trouble with stomach and shoulder. Kidney isn't functioning 100%, also having heart trouble.        HPI:  Discussed the use of AI scribe software for clinical note transcription with the patient, who gave verbal consent to proceed.  History of Present Illness   Joy Moreno is an 83 year old female with heart issues, pulmonary embolism, and kidney disease who presents for medication review and follow-up care. She is accompanied by her daughter, Joy Moreno.  She recently spent nearly three months in [place] , during which she became ill and was hospitalized. She was discharged with medications including Toprol  daily, Eliquis  twice daily, and hydralazine . However, she is missing pantoprazole  for her stomach ulcer, which is currently empty. Previously, she was on glipizide for diabetes, which was discontinued, and she is not currently on any diabetes medication or insulin .  She is on two diuretics, Lasix  (furosemide ) twice daily and bumetanide, prescribed by different doctors. Her fluid intake is restricted to four ounces at a time due to her kidney condition. She monitors her blood pressure at home, which is 108/60 in the morning and around 120 in the evening. She experiences leg swelling, monitored by checking for imprints after pressing on the skin.  She has a history of ulcers, which are not currently bleeding, and experiences stomach problems. She was given allopurinol  to manage high uric acid levels due to her kidney disease, although she does not have gout. Her kidney function is a concern, and she has not had labs done since July.  She was admitted to the hospital with breathing problems due to a pulmonary embolism, which led to heart injury and subsequent fluid retention in her lungs.  She was treated with water pills to manage fluid retention.  She was scheduled for a coronary angiograph, left heart cath, and right heart cath on September 17th, but decided to stay and seek local medical care instead.         OBJECTIVE:       03/16/2024    9:41 AM  Depression screen PHQ 2/9  Decreased Interest 0  Down, Depressed, Hopeless 0  PHQ - 2 Score 0     BP Readings from Last 3 Encounters:  03/16/24 (!) 140/88  04/24/20 (!) 180/77  08/05/19 (!) 150/86    BP (!) 140/88   Pulse 63   Ht 5' 8 (1.727 m)   Wt 158 lb 3.2 oz (71.8 kg)   SpO2 98%   BMI 24.05 kg/m    Physical Exam          Physical Exam Constitutional:      General: She is not in acute distress.    Appearance: She is normal weight. She is not ill-appearing, toxic-appearing or diaphoretic.  HENT:     Head: Normocephalic.  Cardiovascular:     Rate and Rhythm: Normal rate and regular rhythm.     Pulses: Normal pulses.     Heart sounds: Normal heart sounds.  Pulmonary:     Effort: Pulmonary effort is normal.  Musculoskeletal:     Right lower leg: No edema.     Left lower leg: No edema.  Neurological:     General: No focal deficit present.     Mental Status: She is alert and  oriented to person, place, and time. Mental status is at baseline.     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 Diuretics may harm kidney function. Blood pressure elevated in clinic but stable at home. Focus on optimizing heart and kidney function while managing fluid balance. - Refer to heart failure specialist urgently. - Refer to nephrologist. - Hold bumetanide as unlikely neccesary to be taking lasix  40 BID and bumetadinide 1mg  BID.  - Order blood work to assess kidney function. - Monitor blood pressure at home morning and evening. - Refill all medications during this visit.  - Refill metoprolol , hydralazine  and furosemide .   Chronic pulmonary embolism Pulmonary embolism caused respiratory distress and heart injury. On Eliquis  twice daily. - Continue Eliquis  twice daily.  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 Dose adjustment may be needed based on kidney function tests. - Continue allopurinol . - Adjust allopurinol  dose based on blood work results.  Type 2 diabetes mellitus No diabetes medications due to kidney concerns. Previous medications discontinued. Focus on heart and kidney health.  Gastric ulcer Gastric ulcer with no current bleeding. Requires gastroenterology follow-up. - Refer to gastroenterologist.        Total time spent on the date of the encounter was 63 minutes, which included reviewing the patient's chart, performing a history and physical exam, ordering and reviewing studies, coordinating care, and counseling the patient regarding diagnosis and treatment options. The time spent was medically necessary and supports billing based on total time.   Anaysha Andre A. Vita MD [facility]

## 2024-03-21 ENCOUNTER — Ambulatory Visit: Payer: Self-pay | Admitting: Family Medicine

## 2024-04-12 LAB — LAB REPORT - SCANNED
Albumin, Urine POC: 284.5
Creatinine, POC: 90 mg/dL
Microalb Creat Ratio: 316

## 2024-04-13 ENCOUNTER — Encounter (HOSPITAL_COMMUNITY): Admitting: Cardiology

## 2024-04-20 ENCOUNTER — Other Ambulatory Visit: Payer: Self-pay | Admitting: Family Medicine

## 2024-05-04 ENCOUNTER — Other Ambulatory Visit: Payer: Self-pay | Admitting: Family Medicine

## 2024-05-04 DIAGNOSIS — I5020 Unspecified systolic (congestive) heart failure: Secondary | ICD-10-CM

## 2024-05-04 DIAGNOSIS — K279 Peptic ulcer, site unspecified, unspecified as acute or chronic, without hemorrhage or perforation: Secondary | ICD-10-CM

## 2024-05-04 MED ORDER — PANTOPRAZOLE SODIUM 20 MG PO TBEC: 40.0000 mg | DELAYED_RELEASE_TABLET | Freq: Two times a day (BID) | ORAL | 2 refills | Status: DC

## 2024-05-04 NOTE — Telephone Encounter (Unsigned)
 Copied from CRM #[id number]. Topic: Clinical - Medication Refill >> May 04, 2024 10:35 AM Hadassah PARAS wrote: Medication: pantoprazole  (PROTONIX ) 20 MG tablet  Has the patient contacted their pharmacy? Yes (Agent: If no, request that the patient contact the pharmacy for the refill. If patient does not wish to contact the pharmacy document the reason why and proceed with request.) (Agent: If yes, when and what did the pharmacy advise?)  This is the patient's preferred pharmacy:    [facility] - [place], [place] - [address] [place] [address] Phone: [phone] Fax: [phone]  Is this the correct pharmacy for this prescription? Yes If no, delete pharmacy and type the correct one.   Has the prescription been filled recently? No  Is the patient out of the medication? Yes  Has the patient been seen for an appointment in the last year OR does the patient have an upcoming appointment? Yes  Can we respond through MyChart? no  Agent: Please be advised that Rx refills may take up to 3 business days. We ask that you follow-up with your pharmacy.

## 2024-05-09 ENCOUNTER — Other Ambulatory Visit: Payer: Self-pay | Admitting: Family Medicine

## 2024-05-09 ENCOUNTER — Telehealth (HOSPITAL_COMMUNITY): Payer: Self-pay | Admitting: Cardiology

## 2024-05-09 NOTE — Telephone Encounter (Signed)
 Called to confirm/remind patient of their appointment at the [facility] on 05/09/24.   Appointment:   [x] Confirmed  [] Left mess   [] No answer/No voice mail  [] VM Full/unable to leave message  [] Phone not in service  Patient reminded to bring all medications and/or complete list.  Confirmed patient has transportation. Gave directions, instructed to utilize valet parking.

## 2024-05-10 ENCOUNTER — Other Ambulatory Visit (HOSPITAL_COMMUNITY): Payer: Self-pay

## 2024-05-10 ENCOUNTER — Ambulatory Visit (HOSPITAL_COMMUNITY)
Admission: RE | Admit: 2024-05-10 | Discharge: 2024-05-10 | Disposition: A | Discharge status: Home / Self Care | Source: Ambulatory Visit | Attending: Cardiology | Admitting: Cardiology

## 2024-05-10 ENCOUNTER — Ambulatory Visit (HOSPITAL_COMMUNITY): Payer: Self-pay | Admitting: Cardiology

## 2024-05-10 VITALS — BP 146/76 | HR 71 | Wt 163.0 lb

## 2024-05-10 DIAGNOSIS — E782 Mixed hyperlipidemia: Secondary | ICD-10-CM

## 2024-05-10 DIAGNOSIS — Z7901 Long term (current) use of anticoagulants: Secondary | ICD-10-CM | POA: Diagnosis not present

## 2024-05-10 DIAGNOSIS — E785 Hyperlipidemia, unspecified: Secondary | ICD-10-CM | POA: Diagnosis not present

## 2024-05-10 DIAGNOSIS — E118 Type 2 diabetes mellitus with unspecified complications: Secondary | ICD-10-CM

## 2024-05-10 DIAGNOSIS — I130 Hypertensive heart and chronic kidney disease with heart failure and stage 1 through stage 4 chronic kidney disease, or unspecified chronic kidney disease: Secondary | ICD-10-CM | POA: Diagnosis present

## 2024-05-10 DIAGNOSIS — N183 Chronic kidney disease, stage 3 unspecified: Secondary | ICD-10-CM | POA: Insufficient documentation

## 2024-05-10 DIAGNOSIS — Z86711 Personal history of pulmonary embolism: Secondary | ICD-10-CM | POA: Insufficient documentation

## 2024-05-10 DIAGNOSIS — Z86718 Personal history of other venous thrombosis and embolism: Secondary | ICD-10-CM | POA: Diagnosis not present

## 2024-05-10 DIAGNOSIS — I5020 Unspecified systolic (congestive) heart failure: Secondary | ICD-10-CM

## 2024-05-10 DIAGNOSIS — I428 Other cardiomyopathies: Secondary | ICD-10-CM | POA: Diagnosis not present

## 2024-05-10 DIAGNOSIS — I5022 Chronic systolic (congestive) heart failure: Secondary | ICD-10-CM | POA: Insufficient documentation

## 2024-05-10 LAB — COMPREHENSIVE METABOLIC PANEL WITH GFR
ALT: 10 U/L (ref 0–44)
AST: 16 U/L (ref 15–41)
Albumin: 3.5 g/dL (ref 3.5–5.0)
Alkaline Phosphatase: 56 U/L (ref 38–126)
Anion gap: 13 (ref 5–15)
BUN: 35 mg/dL — ABNORMAL HIGH (ref 8–23)
CO2: 24 mmol/L (ref 22–32)
Calcium: 9.2 mg/dL (ref 8.9–10.3)
Chloride: 103 mmol/L (ref 98–111)
Creatinine, Ser: 2.4 mg/dL — ABNORMAL HIGH (ref 0.44–1.00)
GFR, Estimated: 20 mL/min — ABNORMAL LOW (ref >60)
Glucose, Bld: 116 mg/dL — ABNORMAL HIGH (ref 70–99)
Potassium: 4.3 mmol/L (ref 3.5–5.1)
Sodium: 140 mmol/L (ref 135–145)
Total Bilirubin: 0.7 mg/dL (ref 0.0–1.2)
Total Protein: 7.6 g/dL (ref 6.5–8.1)

## 2024-05-10 LAB — CBC
HCT: 28.9 % — ABNORMAL LOW (ref 36.0–46.0)
Hemoglobin: 9.1 g/dL — ABNORMAL LOW (ref 12.0–15.0)
MCH: 28.2 pg (ref 26.0–34.0)
MCHC: 31.5 g/dL (ref 30.0–36.0)
MCV: 89.5 fL (ref 80.0–100.0)
Platelets: 200 K/uL (ref 150–400)
RBC: 3.23 MIL/uL — ABNORMAL LOW (ref 3.87–5.11)
RDW: 17.6 % — ABNORMAL HIGH (ref 11.5–15.5)
WBC: 7.1 K/uL (ref 4.0–10.5)
nRBC: 0 % (ref 0.0–0.2)

## 2024-05-10 LAB — LIPID PANEL
Cholesterol: 195 mg/dL (ref 0–200)
HDL: 55 mg/dL (ref >40)
LDL Cholesterol: 120 mg/dL — ABNORMAL HIGH (ref 0–99)
Total CHOL/HDL Ratio: 3.5 ratio
Triglycerides: 98 mg/dL (ref <150)
VLDL: 20 mg/dL (ref 0–40)

## 2024-05-10 LAB — BRAIN NATRIURETIC PEPTIDE: B Natriuretic Peptide: 948.5 pg/mL — ABNORMAL HIGH (ref 0.0–100.0)

## 2024-05-10 MED ORDER — ISOSORBIDE MONONITRATE ER 30 MG PO TB24: 30.0000 mg | ORAL_TABLET | Freq: Every day | ORAL | 3 refills | Status: AC

## 2024-05-10 MED ORDER — FARXIGA 10 MG PO TABS: 10.0000 mg | ORAL_TABLET | Freq: Every day | ORAL | 3 refills | Status: DC

## 2024-05-10 MED ORDER — FUROSEMIDE 40 MG PO TABS: 40.0000 mg | ORAL_TABLET | Freq: Every day | ORAL | 3 refills | Status: DC

## 2024-05-10 NOTE — Patient Instructions (Addendum)
 STOP Hydrochlorothiazide   START Imdur 30 mg daily.  START Farixga 10 mg daily.  CHANGE Lasix  to 40 mg daily.  Labs done today, your results will be available in MyChart, we will contact you for abnormal readings.  REPEAT blood work in 10 days.  Your physician has requested that you have an echocardiogram. Echocardiography is a painless test that uses sound waves to create images of your heart. It provides your doctor with information about the size and shape of your heart and how well your heart's chambers and valves are working. This procedure takes approximately one hour. There are no restrictions for this procedure. Please do NOT wear cologne, perfume, aftershave, or lotions (deodorant is allowed). Please arrive 15 minutes prior to your appointment time.  Please note: We ask at that you not bring children with you during ultrasound (echo/ vascular) testing. Due to room size and safety concerns, children are not allowed in the ultrasound rooms during exams. Our front office staff cannot provide observation of children in our lobby area while testing is being conducted. An adult accompanying a patient to their appointment will only be allowed in the ultrasound room at the discretion of the ultrasound technician under special circumstances. We apologize for any inconvenience.  Your physician recommends that you schedule a follow-up appointment in: 3 weeks.  If you have any questions or concerns before your next appointment please send us  a message through [place] or call our office at [phone].    TO LEAVE A MESSAGE FOR THE NURSE SELECT OPTION 2, PLEASE LEAVE A MESSAGE INCLUDING: YOUR NAME DATE OF BIRTH CALL BACK NUMBER REASON FOR CALL**this is important as we prioritize the call backs  YOU WILL RECEIVE A CALL BACK THE SAME DAY AS LONG AS YOU CALL BEFORE 4:00 PM  At the [facility], you and your health needs are our priority. As part of our continuing mission to  provide you with exceptional heart care, we have created designated Provider Care Teams. These Care Teams include your primary Cardiologist (physician) and Advanced Practice Providers (APPs- Physician Assistants and Nurse Practitioners) who all work together to provide you with the care you need, when you need it.   You may see any of the following providers on your designated Care Team at your next follow up: Dr Toribio Fuel Dr Ezra Shuck Dr. Morene Brownie Greig Mosses, NP Caffie Shed, [place] [facility] [place], [place] Beckey Coe, NP [name] Lee, NP Ellouise Class, NP Tinnie Redman, PharmD Jaun Bash, PharmD   Please be sure to bring in all your medications bottles to every appointment.    Thank you for choosing [facility]

## 2024-05-11 NOTE — Progress Notes (Signed)
 PCP: [name], MD Cardiology: Dr. Rolan  Chief complaint: CHF  84 y.o. with history of chronic systolic CHF, PE/DVT, and CKD stage 3 was referred by Dr. Vita for evaluation of CHF.  She was admitted in 6/25 to a hospital in [place]  where she had been visiting her daughter for shortness of breath and leg swelling that started after a long car trip from [place]  to [facility]. CTA chest demonstrated multifocal subsegmental pulmonary emboli in the left upper lobe branches. Trace thrombus in subsegmental right upper lobe branches, no right heart strain but moderate pericardial effusion. She arrived mildly hypoxemic on 2L [place], otherwise afebrile, hemodynamically stable. Troponin I 1530 > 1546 > 1760. ProBNP 32k. Cr 2.44. Echo during this admission demonstrated LVEF 20-25% with RWMA in LAD distribution, moderate-severe low-flow low-gradient aortic stenosis, small-moderate pericardial effusion. She was diuresed to euvolemia with resolution of LE edema and shortness of breath. She subsequently returned home to [facility].   She lives here in [place] alone.  She is taking all her medications.  Creatinine has remained elevated, most recently 2.12.  She denies exertional dyspnea or chest pain though she is not particularly active.  She does all her ADLs without problems.  BP is mildly elevated in the office today. She does not have any leg swelling at this point. No BRBPR/melena.   ECG (personally reviewed): NSR, lateral TWIs  Labs (9/25): K 3.5, creatinine 2.12  PMH: 1. DVT/PE: In 6/25, after long car trip.  2. HTN 3. Gout 4. GERD 5. Type 2 diabetes 6. CKD stage 3 7. CHF: Cardiomyopathy of uncertain etiology.  - Echo (6/25) with EF 20-25%, LAD territory WMAs, moderate-severe low flow/low gradient aortic stenosis, small to moderate pericardial effusion.   FH: No history of PE/DVT or heart problems that she knows of.   SH: Lives alone in [place], has daughter in [place] .  No smoking or ETOH.   ROS:  All systems negative except as per HPI.   Current Outpatient Medications  Medication Sig Dispense Refill   Accu-Chek FastClix Lancets MISC CHECK BLOOD SUGAR 3 TIMES PER DAY     ACCU-CHEK GUIDE test strip use to check blood sugars three times daily     allopurinol  (ZYLOPRIM ) 100 MG tablet Take 1 tablet (100 mg total) by mouth daily. 90 tablet 0   atorvastatin  (LIPITOR) 80 MG tablet Take 1 tablet (80 mg total) by mouth daily. 90 tablet 1   Blood Glucose Monitoring Suppl (ACCU-CHEK GUIDE) w/Device KIT USE METER 3 TIMES DAILY TO CHECK BS     ELIQUIS  5 MG TABS tablet Take 1 tablet (5 mg total) by mouth 2 (two) times daily. 180 tablet 1   FARXIGA 10 MG TABS tablet Take 1 tablet (10 mg total) by mouth daily before breakfast. 90 tablet 3   hydrALAZINE  (APRESOLINE ) 25 MG tablet Take 1 tablet (25 mg total) by mouth 3 (three) times daily. 90 tablet 1   ibuprofen  (ADVIL ,MOTRIN ) 200 MG tablet Take 400 mg by mouth every 6 (six) hours as needed. For pain     isosorbide mononitrate (IMDUR) 30 MG 24 hr tablet Take 1 tablet (30 mg total) by mouth daily. 90 tablet 3   metoprolol  succinate (TOPROL -XL) 25 MG 24 hr tablet Take 1 tablet (25 mg total) by mouth daily. 90 tablet 1   pantoprazole  (PROTONIX ) 20 MG tablet Take 2 tablets (40 mg total) by mouth 2 (two) times daily. 120 tablet 2   Vitamin D , Ergocalciferol , (DRISDOL ) 1.25 MG (50000 UNIT) CAPS capsule  Take 1 capsule (50,000 Units total) by mouth once a week. 5 capsule 1   furosemide  (LASIX ) 40 MG tablet Take 1 tablet (40 mg total) by mouth daily. 90 tablet 3   No current facility-administered medications for this encounter.   BP (!) 146/76   Pulse 71   Wt 73.9 kg (163 lb)   SpO2 99%   BMI 24.78 kg/m  General: NAD Neck: No JVD, no thyromegaly or thyroid nodule.  Lungs: Clear to auscultation bilaterally with normal respiratory effort. CV: Nondisplaced PMI.  Heart regular S1/S2, no S3/S4, no murmur.  No peripheral edema.  No carotid bruit.  Normal pedal  pulses.  Abdomen: Soft, nontender, no hepatosplenomegaly, no distention.  Skin: Intact without lesions or rashes.  Neurologic: Alert and oriented x 3.  Psych: Normal affect. Extremities: No clubbing or cyanosis.  HEENT: Normal.   1. Chronic systolic CHF: Cardiomyopathy of uncertain etiology.  Admitted in 6/25 with PE/DVT, echo at that time (in [place] ) showed EF 20-25%, LAD territory WMAs, moderate-severe low flow/low gradient aortic stenosis, small to moderate pericardial effusion. NYHA class I-II.  She is not volume overloaded on exam.  - She has been taking Lasix  40 mg bid.  With elevated creatinine, I am going to decrease this to 40 mg once daily. We may be able to decrease this further.  - Add Farxiga 10 mg daily. BMET/BNP today, BMET in 10 days.  - Continue hydralazine  25 mg tid and add Imdur 30 mg daily.  - Continue Toprol  XL 25 mg daily.  - She is not currently on ARNI or spironolactone due to elevated creatinine.  - I will arrange for repeat echo.  - Ideally, she would have RHC/LHC to assess coronaries with low EF.  We discussed risks/benefits and she agrees to procedure.  However, before setting this up, will need to see what her creatinine is running.  May need to schedule down the road when renal function is stabilized.  2. CKD stage 3: Last creatinine in 9/25 was 2.12.  - As above, she does not look volume overloaded and will cut back on Lasix .  - Add SGLT2 inhibitor.  - Needs nephrologist.  3. PE/DVT: In 6/25, triggered by long car ride. She had not had a prior event.  - With low EF, would continue Eliquis  long-term.  4. Hyperlipidemia: Check lipids today, aim for LDL < 55.  5. HTN: BP mildly elevated, will adjust meds over time.   Followup with APP in 3 wks.   I spent 56 minutes reviewing records, interviewing/examining patient, and managing orders.   Ezra Shuck 05/11/2024

## 2024-05-15 NOTE — Addendum Note (Signed)
 Addended by: MICAEL PORTO A on: 05/15/2024 03:38 PM   Modules accepted: Orders

## 2024-05-19 ENCOUNTER — Telehealth: Payer: Self-pay

## 2024-05-19 NOTE — Telephone Encounter (Signed)
 Copied from CRM [id number]. Topic: General - Call Back - No Documentation >> May 19, 2024  9:42 AM Joy Moreno wrote: Reason for CRM: Patient states received a call this morning, no note left. Requesting a callback to confirm what call was regarding.   Patient can be reached at [phone]

## 2024-05-22 ENCOUNTER — Ambulatory Visit: Payer: Self-pay | Admitting: Family Medicine

## 2024-05-22 ENCOUNTER — Encounter: Payer: Self-pay | Admitting: Family Medicine

## 2024-05-22 ENCOUNTER — Ambulatory Visit (HOSPITAL_COMMUNITY)
Admission: RE | Admit: 2024-05-22 | Discharge: 2024-05-22 | Disposition: A | Source: Ambulatory Visit | Attending: Cardiology

## 2024-05-22 VITALS — BP 126/80 | HR 60 | Ht 67.0 in | Wt 164.2 lb

## 2024-05-22 DIAGNOSIS — I5020 Unspecified systolic (congestive) heart failure: Secondary | ICD-10-CM | POA: Diagnosis present

## 2024-05-22 DIAGNOSIS — E118 Type 2 diabetes mellitus with unspecified complications: Secondary | ICD-10-CM

## 2024-05-22 DIAGNOSIS — Z0000 Encounter for general adult medical examination without abnormal findings: Secondary | ICD-10-CM | POA: Diagnosis not present

## 2024-05-22 DIAGNOSIS — Z7984 Long term (current) use of oral hypoglycemic drugs: Secondary | ICD-10-CM

## 2024-05-22 DIAGNOSIS — E119 Type 2 diabetes mellitus without complications: Secondary | ICD-10-CM

## 2024-05-22 DIAGNOSIS — N1832 Chronic kidney disease, stage 3b: Secondary | ICD-10-CM | POA: Diagnosis not present

## 2024-05-22 LAB — BASIC METABOLIC PANEL WITH GFR
Anion gap: 10 (ref 5–15)
BUN: 34 mg/dL — ABNORMAL HIGH (ref 8–23)
CO2: 24 mmol/L (ref 22–32)
Calcium: 9.1 mg/dL (ref 8.9–10.3)
Chloride: 103 mmol/L (ref 98–111)
Creatinine, Ser: 2.89 mg/dL — ABNORMAL HIGH (ref 0.44–1.00)
GFR, Estimated: 16 mL/min — ABNORMAL LOW (ref >60)
Glucose, Bld: 126 mg/dL — ABNORMAL HIGH (ref 70–99)
Potassium: 4.4 mmol/L (ref 3.5–5.1)
Sodium: 137 mmol/L (ref 135–145)

## 2024-05-22 MED ORDER — FUROSEMIDE 20 MG PO TABS: 20.0000 mg | ORAL_TABLET | Freq: Every day | ORAL | 3 refills | Status: AC

## 2024-05-22 NOTE — Progress Notes (Signed)
   Name: Joy Moreno   Date of Visit: 05/22/24   Date of last visit with me: 05/09/2024   CHIEF COMPLAINT:  Chief Complaint  Patient presents with   Annual Exam    Cpe. Awv. Doing good.        HPI:  Discussed the use of AI scribe software for clinical note transcription with the patient, who gave verbal consent to proceed.  History of Present Illness   Joy Moreno is an 83 year old female who presents for medication management and follow-up.  She recently had her medication regimen adjusted by her cardiologist. She is currently taking Lasix , which was reduced from 40 mg to 20 mg. But review of patients medication brought in shows patinet still has been taking the 40mg  pills.  She is also on atorvastatin  80 mg for cholesterol management and has an upcoming appointment with a lipid clinic on the 23rd of the month.  She reports feeling better and stronger recently.  She traveled to the appointment using public transportation and plans to spend Thanksgiving and New Year's at home with family.         OBJECTIVE:       05/22/2024   11:29 AM  Depression screen PHQ 2/9  Decreased Interest 0  Down, Depressed, Hopeless 0  PHQ - 2 Score 0     BP Readings from Last 3 Encounters:  05/22/24 126/80  05/10/24 (!) 146/76  03/16/24 (!) 140/88    BP 126/80   Pulse 60   Ht 5' 7 (1.702 m)   Wt 164 lb 3.2 oz (74.5 kg)   SpO2 98%   BMI 25.72 kg/m    Physical Exam          Physical Exam  ASSESSMENT/PLAN:   Assessment & Plan Encounter for Medicare annual wellness exam  Annual physical exam  HFrEF (heart failure with reduced ejection fraction) (HCC)  CKD stage 3b, GFR 30-44 ml/min (HCC)  Controlled type 2 diabetes mellitus with complication, without long-term current use of insulin  (HCC)  Diabetes mellitus treated with oral medication (HCC)    Assessment and Plan    Adult Wellness Visit Routine wellness visit with no acute concerns. Reports feeling  better and improved strength. - Continue current management and monitoring.  Systolic (congestive) heart failure Managed by cardiologist with recent medication adjustments. Currently on 40 mg of Lasix , but should be on 20 mg. - Ordered 20 mg Lasix  tablets and instructed to take one pill daily. Decreased from 40 - Repeat lab work in 4-6 weeks to monitor kidney function after starting 20 mg Lasix .  Chronic kidney disease, stage 3b Monitoring kidney function due to diuretic use. - Repeat lab work in two weeks to monitor kidney function after starting 20 mg Lasix . - GFR 20, consider following up with transplant team if neccesary once lab values <20  Hyperlipidemia Managed with atorvastatin  80 mg. Referral to lipid clinic for further management to lower cholesterol levels. - Continue atorvastatin  80 mg daily. - Attend lipid clinic appointment on November 23rd for further management.         Syd Manges A. Vita MD [facility]

## 2024-05-22 NOTE — Progress Notes (Signed)
 Chief Complaint  Patient presents with   Annual Exam    Cpe. Awv. Doing good.      Subjective:   Joy Moreno is a 83 y.o. female who presents for a Medicare Annual Wellness Visit.  Allergies (verified) Sulfa antibiotics and Aspirin   History: Past Medical History:  Diagnosis Date   Borderline diabetic    Past Surgical History:  Procedure Laterality Date   BACK SURGERY     CESAREAN SECTION     History reviewed. No pertinent family history. Social History   Occupational History   Not on file  Tobacco Use   Smoking status: Former    Types: Cigarettes   Smokeless tobacco: Not on file  Substance and Sexual Activity   Alcohol use: No   Drug use: No   Sexual activity: Not on file   Tobacco Counseling Counseling given: Not Answered  SDOH Screenings   Food Insecurity: No Food Insecurity (05/22/2024)  Housing: Low Risk  (05/22/2024)  Transportation Needs: No Transportation Needs (05/22/2024)  Utilities: Not At Risk (05/22/2024)  Depression (PHQ2-9): Low Risk  (05/22/2024)  Financial Resource Strain: Low Risk (12/15/2023)   Received from [facility]  Physical Activity: Unknown (05/22/2024)  Social Connections: Unknown (05/22/2024)  Stress: No Stress Concern Present (05/22/2024)  Tobacco Use: Medium Risk (05/22/2024)  Health Literacy: Adequate Health Literacy (05/22/2024)   See flowsheets for full screening details  Depression Screen PHQ 2 & 9 Depression Scale- Over the past 2 weeks, how often have you been bothered by any of the following problems? Little interest or pleasure in doing things: 0 Feeling down, depressed, or hopeless (PHQ Adolescent also includes...irritable): 0 PHQ-2 Total Score: 0     Goals Addressed             This Visit's Progress    Patient Stated       Getting cholesterol and blood pressure down.        Visit info / Clinical Intake: Medicare Wellness Visit Type:: Subsequent Annual Wellness Visit Persons participating in  visit:: patient Medicare Wellness Visit Mode:: In-person (required for WTM) Information given by:: patient Interpreter Needed?: No Pre-visit prep was completed: no AWV questionnaire completed by patient prior to visit?: no Living arrangements:: (!) lives alone Patient's Overall Health Status Rating: (!) fair Typical amount of pain: none Does pain affect daily life?: no Are you currently prescribed opioids?: no  Dietary Habits and Nutritional Risks How many meals a day?: 3 Eats fruit and vegetables daily?: yes Most meals are obtained by: preparing own meals In the last 2 weeks, have you had any of the following?: none Diabetic:: (!) yes Any non-healing wounds?: no How often do you check your BS?: 3 Would you like to be referred to a Nutritionist or for Diabetic Management? : no  Functional Status Activities of Daily Living (to include ambulation/medication): Independent Ambulation: Independent Medication Administration: Independent Home Management: Independent Manage your own finances?: yes Primary transportation is: -- (public transportation) Concerns about vision?: no *vision screening is required for WTM* Concerns about hearing?: no  Fall Screening Falls in the past year?: 0 Number of falls in past year: 0 Was there an injury with Fall?: 0 Fall Risk Category Calculator: 0 Patient Fall Risk Level: Low Fall Risk  Fall Risk Patient at Risk for Falls Due to: No Fall Risks Fall risk Follow up: Falls evaluation completed  Home and Transportation Safety: All rugs have non-skid backing?: yes All stairs or steps have railings?: yes Grab bars in the  bathtub or shower?: yes Have non-skid surface in bathtub or shower?: (!) no Good home lighting?: yes Regular seat belt use?: yes Hospital stays in the last year:: (!) yes How many hospital stays:: 2 Reason: heart and kidney issues  Cognitive Assessment Difficulty concentrating, remembering, or making decisions? : no Will  6CIT or Mini Cog be Completed: yes What year is it?: 0 points What month is it?: 0 points Give patient an address phrase to remember (5 components): [address] About what time is it?: 0 points Count backwards from 20 to 1: 2 points Say the months of the year in reverse: 4 points Repeat the address phrase from earlier: 0 points 6 CIT Score: 6 points  Advance Directives (For Healthcare) Does Patient Have a Medical Advance Directive?: No Would patient like information on creating a medical advance directive?: Yes (Inpatient - patient defers creating a medical advance directive at this time - Information given)  Reviewed/Updated  Reviewed/Updated: Reviewed All (Medical, Surgical, Family, Medications, Allergies, Care Teams, Patient Goals)        Objective:    There were no vitals filed for this visit. There is no height or weight on file to calculate BMI.  Current Medications (verified) Outpatient Encounter Medications as of 05/22/2024  Medication Sig   Accu-Chek FastClix Lancets MISC CHECK BLOOD SUGAR 3 TIMES PER DAY   ACCU-CHEK GUIDE test strip use to check blood sugars three times daily   allopurinol  (ZYLOPRIM ) 100 MG tablet Take 1 tablet (100 mg total) by mouth daily.   atorvastatin  (LIPITOR) 80 MG tablet Take 1 tablet (80 mg total) by mouth daily.   Blood Glucose Monitoring Suppl (ACCU-CHEK GUIDE) w/Device KIT USE METER 3 TIMES DAILY TO CHECK BS   ELIQUIS  5 MG TABS tablet Take 1 tablet (5 mg total) by mouth 2 (two) times daily.   FARXIGA 10 MG TABS tablet Take 1 tablet (10 mg total) by mouth daily before breakfast.   furosemide  (LASIX ) 40 MG tablet Take 1 tablet (40 mg total) by mouth daily.   ibuprofen  (ADVIL ,MOTRIN ) 200 MG tablet Take 400 mg by mouth every 6 (six) hours as needed. For pain   isosorbide mononitrate (IMDUR) 30 MG 24 hr tablet Take 1 tablet (30 mg total) by mouth daily.   metoprolol  succinate (TOPROL -XL) 25 MG 24 hr tablet Take 1 tablet (25 mg total) by mouth  daily.   pantoprazole  (PROTONIX ) 20 MG tablet Take 2 tablets (40 mg total) by mouth 2 (two) times daily.   Vitamin D , Ergocalciferol , (DRISDOL ) 1.25 MG (50000 UNIT) CAPS capsule Take 1 capsule (50,000 Units total) by mouth once a week.   hydrALAZINE  (APRESOLINE ) 25 MG tablet Take 1 tablet (25 mg total) by mouth 3 (three) times daily. (Patient not taking: Reported on 05/22/2024)   No facility-administered encounter medications on file as of 05/22/2024.   Hearing/Vision screen No results found. Immunizations and Health Maintenance Health Maintenance  Topic Date Due   FOOT EXAM  Never done   OPHTHALMOLOGY EXAM  Never done   DTaP/Tdap/Td (1 - Tdap) Never done   DEXA SCAN  Never done   COVID-19 Vaccine (1 - 2025-26 season) 06/07/2024 (Originally 03/06/2024)   Zoster Vaccines- Shingrix (1 of 2) 06/15/2024 (Originally 02/20/1992)   Influenza Vaccine  10/03/2024 (Originally 02/04/2024)   Pneumococcal Vaccine: 50+ Years (1 of 2 - PCV) 03/16/2025 (Originally 02/19/1960)   HEMOGLOBIN A1C  09/13/2024   Diabetic kidney evaluation - Urine ACR  04/12/2025   Diabetic kidney evaluation - eGFR measurement  05/10/2025   Medicare Annual Wellness (AWV)  05/22/2025   Meningococcal B Vaccine  Aged Out        Assessment/Plan:  This is a routine wellness examination for Joy Moreno.  Patient Care Team: Ethelda Deangelo, MD as PCP - General (Family Medicine) Ezra Shuck MD - Cardiology (heart failure) I have personally reviewed and noted the following in the patient's chart:   Medical and social history Use of alcohol, tobacco or illicit drugs  Current medications and supplements including opioid prescriptions. Functional ability and status Nutritional status Physical activity Advanced directives List of other physicians Hospitalizations, surgeries, and ER visits in previous 12 months Vitals Screenings to include cognitive, depression, and falls Referrals and appointments  No orders of the defined types  were placed in this encounter.  In addition, I have reviewed and discussed with patient certain preventive protocols, quality metrics, and best practice recommendations. A written personalized care plan for preventive services as well as general preventive health recommendations were provided to patient.   Carlo JAYSON Salmon, CMA   05/22/2024   No follow-ups on file.  After Visit Summary: (In Person-Printed) AVS printed and given to the patient  Nurse Notes: none

## 2024-05-29 ENCOUNTER — Other Ambulatory Visit: Payer: Self-pay | Admitting: Family Medicine

## 2024-05-30 ENCOUNTER — Telehealth (HOSPITAL_COMMUNITY): Payer: Self-pay

## 2024-05-30 NOTE — Telephone Encounter (Signed)
 Called to confirm/remind patient of their appointment at the [facility] on 05/31/24.   Appointment:   [] Confirmed  [] Left mess   [x] No answer/No voice mail  [] VM Full/unable to leave message  [] Phone not in service

## 2024-05-31 ENCOUNTER — Ambulatory Visit (HOSPITAL_COMMUNITY)
Admission: RE | Admit: 2024-05-31 | Discharge: 2024-05-31 | Disposition: A | Discharge status: Home / Self Care | Source: Ambulatory Visit | Attending: Cardiology | Admitting: Cardiology

## 2024-05-31 ENCOUNTER — Encounter (HOSPITAL_COMMUNITY): Payer: Self-pay

## 2024-05-31 ENCOUNTER — Ambulatory Visit (HOSPITAL_COMMUNITY): Payer: Self-pay | Admitting: Cardiology

## 2024-05-31 VITALS — BP 152/76 | HR 55 | Ht 67.0 in | Wt 163.8 lb

## 2024-05-31 DIAGNOSIS — I429 Cardiomyopathy, unspecified: Secondary | ICD-10-CM | POA: Insufficient documentation

## 2024-05-31 DIAGNOSIS — I3139 Other pericardial effusion (noninflammatory): Secondary | ICD-10-CM | POA: Insufficient documentation

## 2024-05-31 DIAGNOSIS — E785 Hyperlipidemia, unspecified: Secondary | ICD-10-CM

## 2024-05-31 DIAGNOSIS — I5022 Chronic systolic (congestive) heart failure: Secondary | ICD-10-CM | POA: Diagnosis present

## 2024-05-31 DIAGNOSIS — I5020 Unspecified systolic (congestive) heart failure: Secondary | ICD-10-CM | POA: Diagnosis not present

## 2024-05-31 DIAGNOSIS — N183 Chronic kidney disease, stage 3 unspecified: Secondary | ICD-10-CM | POA: Diagnosis not present

## 2024-05-31 DIAGNOSIS — I350 Nonrheumatic aortic (valve) stenosis: Secondary | ICD-10-CM | POA: Insufficient documentation

## 2024-05-31 DIAGNOSIS — Z79899 Other long term (current) drug therapy: Secondary | ICD-10-CM | POA: Diagnosis not present

## 2024-05-31 DIAGNOSIS — E1122 Type 2 diabetes mellitus with diabetic chronic kidney disease: Secondary | ICD-10-CM | POA: Diagnosis not present

## 2024-05-31 DIAGNOSIS — Z7901 Long term (current) use of anticoagulants: Secondary | ICD-10-CM | POA: Diagnosis not present

## 2024-05-31 DIAGNOSIS — I2693 Single subsegmental pulmonary embolism without acute cor pulmonale: Secondary | ICD-10-CM | POA: Diagnosis not present

## 2024-05-31 DIAGNOSIS — I159 Secondary hypertension, unspecified: Secondary | ICD-10-CM

## 2024-05-31 DIAGNOSIS — R0902 Hypoxemia: Secondary | ICD-10-CM | POA: Diagnosis not present

## 2024-05-31 DIAGNOSIS — N1832 Chronic kidney disease, stage 3b: Secondary | ICD-10-CM | POA: Diagnosis not present

## 2024-05-31 DIAGNOSIS — I130 Hypertensive heart and chronic kidney disease with heart failure and stage 1 through stage 4 chronic kidney disease, or unspecified chronic kidney disease: Secondary | ICD-10-CM | POA: Insufficient documentation

## 2024-05-31 DIAGNOSIS — Z7984 Long term (current) use of oral hypoglycemic drugs: Secondary | ICD-10-CM | POA: Diagnosis not present

## 2024-05-31 LAB — BRAIN NATRIURETIC PEPTIDE: B Natriuretic Peptide: 1424.2 pg/mL — ABNORMAL HIGH (ref 0.0–100.0)

## 2024-05-31 LAB — BASIC METABOLIC PANEL WITH GFR
Anion gap: 13 (ref 5–15)
BUN: 32 mg/dL — ABNORMAL HIGH (ref 8–23)
CO2: 22 mmol/L (ref 22–32)
Calcium: 9.2 mg/dL (ref 8.9–10.3)
Chloride: 107 mmol/L (ref 98–111)
Creatinine, Ser: 2.23 mg/dL — ABNORMAL HIGH (ref 0.44–1.00)
GFR, Estimated: 21 mL/min — ABNORMAL LOW (ref >60)
Glucose, Bld: 104 mg/dL — ABNORMAL HIGH (ref 70–99)
Potassium: 4.1 mmol/L (ref 3.5–5.1)
Sodium: 142 mmol/L (ref 135–145)

## 2024-05-31 MED ORDER — HYDRALAZINE HCL 25 MG PO TABS: 25.0000 mg | ORAL_TABLET | Freq: Three times a day (TID) | ORAL | 1 refills | Status: DC

## 2024-05-31 NOTE — Progress Notes (Signed)
 [facility] NOTE PCP: Joy Morrow, MD Cardiology: Dr. Rolan  HPI: Joy Moreno is a 83 y.o. with history of chronic systolic CHF, PE/DVT, and CKD stage 3 was referred by Dr. Vita for evaluation of CHF.    Admitted in 6/25 to a hospital in [place]  where she had been visiting her daughter for shortness of breath and leg swelling that started after a long car trip from [place]  to [facility]. CTA chest demonstrated multifocal subsegmental pulmonary emboli in the left upper lobe branches. Trace thrombus in subsegmental right upper lobe branches, no right heart strain but moderate pericardial effusion. She arrived mildly hypoxemic on 2L [place], otherwise afebrile, hemodynamically stable.Cr 2.44. Echo showed EF 20-25% with RWMA in LAD distribution, moderate-severe low-flow low-gradient aortic stenosis, small-moderate pericardial effusion. She was diuresed to euvolemia with resolution of LE edema and shortness of breath. She subsequently returned home to [facility].   She returns today for heart failure follow up. Overall feeling well. Creatinine continues to rise. She denies shortness of breath, chest pain, dizziness, abnormal bleeding, or orthopnea. Able to perform ADLs. Appetite okay. Has a scale at home, has not been taking her weights. She says that she will start. She reports compliance with her medications, however seems very confused about which BP medication she is supposed to be taking. Not taking her hydralazine , was confusing this with hydrochlorothiazide which was stopped.   Labs (9/25): K 3.5, creatinine 2.12 Labs (11/25): K 4.3, creatinine 2.40 Labs (11/25): K 4.4, creatinine 2.89  PMH: 1. DVT/PE: In 6/25, after long car trip.  2. HTN 3. Gout 4. GERD 5. Type 2 diabetes 6. CKD stage 3 7. CHF: Cardiomyopathy of uncertain etiology.  - Echo (6/25) with EF 20-25%, LAD territory WMAs, moderate-severe low flow/low gradient aortic stenosis, small to moderate pericardial effusion.   FH: No  history of PE/DVT or heart problems that she knows of.   SH: Lives alone in [place], has daughter in [place] .  No smoking or ETOH.   ROS: All systems negative except as per HPI.   Current Outpatient Medications  Medication Sig Dispense Refill   Accu-Chek FastClix Lancets MISC CHECK BLOOD SUGAR 3 TIMES PER DAY     ACCU-CHEK GUIDE test strip use to check blood sugars three times daily     allopurinol  (ZYLOPRIM ) 100 MG tablet Take 1 tablet (100 mg total) by mouth daily. 90 tablet 0   atorvastatin  (LIPITOR) 80 MG tablet Take 1 tablet (80 mg total) by mouth daily. 90 tablet 1   Blood Glucose Monitoring Suppl (ACCU-CHEK GUIDE) w/Device KIT USE METER 3 TIMES DAILY TO CHECK BS     ELIQUIS  5 MG TABS tablet Take 1 tablet (5 mg total) by mouth 2 (two) times daily. 180 tablet 1   FARXIGA  10 MG TABS tablet Take 1 tablet (10 mg total) by mouth daily before breakfast. 90 tablet 3   furosemide  (LASIX ) 20 MG tablet Take 1 tablet (20 mg total) by mouth daily. 30 tablet 3   ibuprofen  (ADVIL ,MOTRIN ) 200 MG tablet Take 400 mg by mouth every 6 (six) hours as needed. For pain     isosorbide  mononitrate (IMDUR ) 30 MG 24 hr tablet Take 1 tablet (30 mg total) by mouth daily. 90 tablet 3   metoprolol  succinate (TOPROL -XL) 25 MG 24 hr tablet Take 1 tablet (25 mg total) by mouth daily. 90 tablet 1   pantoprazole  (PROTONIX ) 20 MG tablet Take 2 tablets (40 mg total) by mouth 2 (two) times daily. 120 tablet  2   Vitamin D , Ergocalciferol , (DRISDOL ) 1.25 MG (50000 UNIT) CAPS capsule Take 1 capsule (50,000 Units total) by mouth once a week. 5 capsule 1   hydrALAZINE  (APRESOLINE ) 25 MG tablet Take 1 tablet (25 mg total) by mouth 3 (three) times daily. 90 tablet 1   No current facility-administered medications for this encounter.   BP (!) 152/76   Pulse (!) 55   Ht 5' 7 (1.702 m)   Wt 74.3 kg (163 lb 12.8 oz)   SpO2 98%   BMI 25.65 kg/m   Filed Weights   05/31/24 1145  Weight: 74.3 kg (163 lb 12.8 oz)    Physical Exam: General: Younger than age appearing. No distress  Cardiac: JVP flat. No 3/6 R/LUSB systolic murmur  Abdomen: Soft, non-distended.  Extremities: Warm and dry.  No edema.  Neuro: A&O x3. Affect pleasant.   1. Chronic systolic CHF: Cardiomyopathy of uncertain etiology. Admitted in 6/25 with PE/DVT, echo at that time (in [place] ) showed EF 20-25%, LAD territory WMAs, moderate-severe low flow/low gradient aortic stenosis, small to moderate pericardial effusion.  - NYHA class I-II.  Euvolemic on exam. - continue lasix  20 mg daily. BMET/BNP today, if Cr up again will change to PRN only  - continue farxiga  10 mg daily. No GU s/s. - restart hydralazine  25 mg tid; continue imdur  30 mg daily.  - stop toprol  with bradycardia and aortic stenosis - She is not currently on ARNI or spironolactone due to elevated creatinine.  - repeat echo scheduled - Ideally, she would have RHC/LHC to assess coronaries with low EF.  We discussed risks/benefits and she agrees to procedure.  Will need to schedule down the road when renal function is stabilized.  2. Aortic Stenosis: Low flow, low gradient on echo 6/25 - loud systolic murmur present - repeat echo ordered - asymptomatic; no dizziness or near syncope - may need TEE and TAVR eval in the future 2. CKD stage 3: Last creatinine in 9/25 was 2.12.  - On SGLT2 inhibitor.  - Needs nephrologist.  - BMET today 3. PE/DVT: In 6/25, triggered by long car ride. She had not had a prior event.  - With low EF, would continue Eliquis  long-term.  4. Hyperlipidemia: Check lipids today, aim for LDL < 55. LDL 120 11/25. - continue atorvastatin  80 mg daily - refer to lipid clinic 5. HTN: BP elevated, restart hydral.   Follow up in 1 month with APP  Kincade Granberg, NP 05/31/2024

## 2024-05-31 NOTE — Patient Instructions (Addendum)
 Good to see you today!  RESTART hydralazine  25 mg Three times a day  Labs done today, your results will be available in MyChart, we will contact you for abnormal readings.  Your physician recommends that you schedule a follow-up appointment as scheduled   If you have any questions or concerns before your next appointment please send us  a message through [place] or call our office at [phone].    TO LEAVE A MESSAGE FOR THE NURSE SELECT OPTION 2, PLEASE LEAVE A MESSAGE INCLUDING: YOUR NAME DATE OF BIRTH CALL BACK NUMBER REASON FOR CALL**this is important as we prioritize the call backs  YOU WILL RECEIVE A CALL BACK THE SAME DAY AS LONG AS YOU CALL BEFORE 4:00 PM At the [facility], you and your health needs are our priority. As part of our continuing mission to provide you with exceptional heart care, we have created designated Provider Care Teams. These Care Teams include your primary Cardiologist (physician) and Advanced Practice Providers (APPs- Physician Assistants and Nurse Practitioners) who all work together to provide you with the care you need, when you need it.   You may see any of the following providers on your designated Care Team at your next follow up: Dr Toribio Fuel Dr Ezra Shuck Dr. Morene Brownie Greig Mosses, NP Caffie Shed, [place] [facility], [place] Beckey Coe, NP [name] Lee, NP Ellouise Class, NP Tinnie Redman, PharmD Jaun Bash, PharmD   Please be sure to bring in all your medications bottles to every appointment.    Thank you for choosing [facility]

## 2024-06-21 ENCOUNTER — Ambulatory Visit (HOSPITAL_COMMUNITY): Admission: RE | Admit: 2024-06-21 | Discharge: 2024-06-21 | Attending: Family Medicine

## 2024-06-21 ENCOUNTER — Other Ambulatory Visit: Payer: Self-pay | Admitting: Family Medicine

## 2024-06-21 DIAGNOSIS — I5020 Unspecified systolic (congestive) heart failure: Secondary | ICD-10-CM | POA: Insufficient documentation

## 2024-06-21 DIAGNOSIS — I3481 Nonrheumatic mitral (valve) annulus calcification: Secondary | ICD-10-CM | POA: Insufficient documentation

## 2024-06-21 DIAGNOSIS — I071 Rheumatic tricuspid insufficiency: Secondary | ICD-10-CM | POA: Insufficient documentation

## 2024-06-21 DIAGNOSIS — I371 Nonrheumatic pulmonary valve insufficiency: Secondary | ICD-10-CM | POA: Insufficient documentation

## 2024-06-21 DIAGNOSIS — I3139 Other pericardial effusion (noninflammatory): Secondary | ICD-10-CM | POA: Diagnosis not present

## 2024-06-21 DIAGNOSIS — I517 Cardiomegaly: Secondary | ICD-10-CM | POA: Insufficient documentation

## 2024-06-21 DIAGNOSIS — I5022 Chronic systolic (congestive) heart failure: Secondary | ICD-10-CM | POA: Diagnosis not present

## 2024-06-21 DIAGNOSIS — I2782 Chronic pulmonary embolism: Secondary | ICD-10-CM

## 2024-06-21 LAB — ECHOCARDIOGRAM COMPLETE
AR max vel: 1.33 cm2
AV Area VTI: 1.46 cm2
AV Area mean vel: 1.28 cm2
AV Mean grad: 8.8 mmHg
AV Peak grad: 14.6 mmHg
Ao pk vel: 1.91 m/s
Area-P 1/2: 3.58 cm2
S' Lateral: 3.3 cm

## 2024-06-27 ENCOUNTER — Ambulatory Visit

## 2024-06-27 NOTE — Progress Notes (Deleted)
 Patient ID: Joy Moreno                 DOB: [birth year]                    MRN: [id number]      HPI: Joy Moreno is a 83 y.o. female patient referred to lipid clinic by Dr. Rolan. PMH is significant for HLD, chronic systolic CHF (ECHO: EF 30-35%), PE/DVT (12/2023), T2DM, stage III CKD and HTN.  Patient was recently seen by [name] in November 2025 for evaluation of CHF. Patient's most recent lipid panel revealed LDL of 120 in 05/2024 on atorvastatin  80 mg. Patient referred to lipid clinic to discuss PCSK9i therapy.   Patient presents today in good spirits.    Reviewed options for lowering LDL cholesterol, including ezetimibe, PCSK-9 inhibitors, bempedoic acid and inclisiran.  Discussed mechanisms of action, dosing, side effects and potential decreases in LDL cholesterol.  Also reviewed cost information and potential options for patient assistance.   Current Medications: atorvastatin  80 mg daily Intolerances: none? Risk Factors: age, chronic systolic CHF (ECHO: EF 30-35%), PE/DVT (12/2023), T2DM, stage III CKD and HTN. LDL goal: < 55 Lipid panel (05/2024): Chol 195, Trig 98, HDL 55, LDL 120 Liver enzymes (05/2024): ALT 10, AST 16, Alk phos 56  Diet:  Breakfast: Lunch/Dinner: Snacks: Beverages:  Exercise:   Family History: get this  Social History:  Alcohol: Smoking:  Labs:  Lipid Panel     Component Value Date/Time   CHOL 195 05/10/2024 1408   TRIG 98 05/10/2024 1408   HDL 55 05/10/2024 1408   CHOLHDL 3.5 05/10/2024 1408   VLDL 20 05/10/2024 1408   LDLCALC 120 (H) 05/10/2024 1408    Past Medical History:  Diagnosis Date   Borderline diabetic     Medications Ordered Prior to Encounter[1]  Allergies[2]  Assessment/Plan:  1. Hyperlipidemia -  No problems updated. No problem-specific Assessment & Plan notes found for this encounter.    Thank you,  Brysen Shankman E. Javarius Tsosie, Pharm.D, CPP [facility]. [facility] [address], [place], [place] [address] Phone: [phone]; Fax: [phone]        [1]  Current Outpatient Medications on File Prior to Visit  Medication Sig Dispense Refill   Accu-Chek FastClix Lancets MISC CHECK BLOOD SUGAR 3 TIMES PER DAY     ACCU-CHEK GUIDE test strip use to check blood sugars three times daily     allopurinol  (ZYLOPRIM ) 100 MG tablet Take 1 tablet (100 mg total) by mouth daily. 90 tablet 0   atorvastatin  (LIPITOR) 80 MG tablet Take 1 tablet (80 mg total) by mouth daily. 90 tablet 1   Blood Glucose Monitoring Suppl (ACCU-CHEK GUIDE) w/Device KIT USE METER 3 TIMES DAILY TO CHECK BS     ELIQUIS  5 MG TABS tablet Take 1 tablet (5 mg total) by mouth 2 (two) times daily. 180 tablet 1   FARXIGA  10 MG TABS tablet Take 1 tablet (10 mg total) by mouth daily before breakfast. 90 tablet 3   furosemide  (LASIX ) 20 MG tablet Take 1 tablet (20 mg total) by mouth daily. 30 tablet 3   hydrALAZINE  (APRESOLINE ) 25 MG tablet Take 1 tablet (25 mg total) by mouth 3 (three) times daily. 90 tablet 1   ibuprofen  (ADVIL ,MOTRIN ) 200 MG tablet Take 400 mg by mouth every 6 (six) hours as needed. For pain     isosorbide  mononitrate (IMDUR ) 30 MG 24 hr tablet  Take 1 tablet (30 mg total) by mouth daily. 90 tablet 3   metoprolol  succinate (TOPROL -XL) 25 MG 24 hr tablet Take 1 tablet (25 mg total) by mouth daily. 90 tablet 1   pantoprazole  (PROTONIX ) 20 MG tablet Take 2 tablets (40 mg total) by mouth 2 (two) times daily. 120 tablet 2   Vitamin D , Ergocalciferol , (DRISDOL ) 1.25 MG (50000 UNIT) CAPS capsule Take 1 capsule (50,000 Units total) by mouth once a week. 5 capsule 1   No current facility-administered medications on file prior to visit.  [2]  Allergies Allergen Reactions   Sulfa Antibiotics Nausea Only   Aspirin Palpitations

## 2024-07-03 ENCOUNTER — Other Ambulatory Visit (HOSPITAL_COMMUNITY): Payer: Self-pay | Admitting: Cardiology

## 2024-07-03 ENCOUNTER — Other Ambulatory Visit: Payer: Self-pay | Admitting: Family Medicine

## 2024-07-03 DIAGNOSIS — I130 Hypertensive heart and chronic kidney disease with heart failure and stage 1 through stage 4 chronic kidney disease, or unspecified chronic kidney disease: Secondary | ICD-10-CM

## 2024-07-03 DIAGNOSIS — I5020 Unspecified systolic (congestive) heart failure: Secondary | ICD-10-CM

## 2024-07-04 ENCOUNTER — Telehealth (HOSPITAL_COMMUNITY): Payer: Self-pay | Admitting: Family Medicine

## 2024-07-05 ENCOUNTER — Ambulatory Visit (HOSPITAL_COMMUNITY)

## 2024-07-05 ENCOUNTER — Telehealth (HOSPITAL_COMMUNITY): Payer: Self-pay

## 2024-07-05 NOTE — Telephone Encounter (Signed)
 Called to confirm/remind patient of their appointment at the [facility] on 07/07/24.   Appointment:   [x] Confirmed  [] Left mess   [] No answer/No voice mail  [] VM Full/unable to leave message  [] Phone not in service  Patient reminded to bring all medications and/or complete list.  Confirmed patient has transportation. Gave directions, instructed to utilize valet parking.

## 2024-07-05 NOTE — Progress Notes (Signed)
 "  ADVANCED HF CLINIC NOTE PCP: [name], MD Cardiology: Dr. Rolan  HPI: Joy Moreno is a 83 y.o. with history of chronic systolic CHF, PE/DVT, and CKD stage 3 was referred by Dr. Vita for evaluation of CHF.    Admitted in 6/25 to a hospital in [place]  where she had been visiting her daughter for shortness of breath and leg swelling that started after a long car trip from [place]  to [facility]. CTA chest demonstrated multifocal subsegmental pulmonary emboli in the left upper lobe branches. Trace thrombus in subsegmental right upper lobe branches, no right heart strain but moderate pericardial effusion. She arrived mildly hypoxemic on 2L [place], otherwise afebrile, hemodynamically stable.Cr 2.44. Echo showed EF 20-25% with RWMA in LAD distribution, moderate-severe low-flow low-gradient aortic stenosis, small-moderate pericardial effusion. She was diuresed to euvolemia with resolution of LE edema and shortness of breath. She subsequently returned home to [facility].   She returns today for heart failure follow up. Overall feeling well. Creatinine continues to rise. She denies shortness of breath, chest pain, dizziness, abnormal bleeding, or orthopnea. Able to perform ADLs. Appetite okay. Has a scale at home, has not been taking her weights. She says that she will start. She reports compliance with her medications, however seems very confused about which BP medication she is supposed to be taking. Not taking her hydralazine , was confusing this with hydrochlorothiazide which was stopped.   Labs (9/25): K 3.5, creatinine 2.12 Labs (11/25): K 4.3, creatinine 2.40 Labs (11/25): K 4.4, creatinine 2.89  PMH: 1. DVT/PE: In 6/25, after long car trip.  2. HTN 3. Gout 4. GERD 5. Type 2 diabetes 6. CKD stage 3 7. CHF: Cardiomyopathy of uncertain etiology.  - Echo (6/25) with EF 20-25%, LAD territory WMAs, moderate-severe low flow/low gradient aortic stenosis, small to moderate pericardial effusion.   FH: No  history of PE/DVT or heart problems that she knows of.   SH: Lives alone in [place], has daughter in [place] .  No smoking or ETOH.   ROS: All systems negative except as per HPI.   Current Outpatient Medications  Medication Sig Dispense Refill   Accu-Chek FastClix Lancets MISC CHECK BLOOD SUGAR 3 TIMES PER DAY     ACCU-CHEK GUIDE test strip use to check blood sugars three times daily     allopurinol  (ZYLOPRIM ) 100 MG tablet Take 1 tablet (100 mg total) by mouth daily. 90 tablet 0   atorvastatin  (LIPITOR) 80 MG tablet Take 1 tablet (80 mg total) by mouth daily. 90 tablet 1   Blood Glucose Monitoring Suppl (ACCU-CHEK GUIDE) w/Device KIT USE METER 3 TIMES DAILY TO CHECK BS     ELIQUIS  5 MG TABS tablet Take 1 tablet (5 mg total) by mouth 2 (two) times daily. 180 tablet 1   FARXIGA  10 MG TABS tablet Take 1 tablet (10 mg total) by mouth daily before breakfast. 90 tablet 3   furosemide  (LASIX ) 20 MG tablet Take 1 tablet (20 mg total) by mouth daily. 30 tablet 3   hydrALAZINE  (APRESOLINE ) 25 MG tablet Take 1 tablet (25 mg total) by mouth 3 (three) times daily. 90 tablet 1   ibuprofen  (ADVIL ,MOTRIN ) 200 MG tablet Take 400 mg by mouth every 6 (six) hours as needed. For pain     isosorbide  mononitrate (IMDUR ) 30 MG 24 hr tablet Take 1 tablet (30 mg total) by mouth daily. 90 tablet 3   metoprolol  succinate (TOPROL -XL) 25 MG 24 hr tablet Take 1 tablet (25 mg total) by mouth daily. 90  tablet 1   pantoprazole  (PROTONIX ) 20 MG tablet Take 2 tablets (40 mg total) by mouth 2 (two) times daily. 120 tablet 2   Vitamin D , Ergocalciferol , (DRISDOL ) 1.25 MG (50000 UNIT) CAPS capsule Take 1 capsule (50,000 Units total) by mouth once a week. 5 capsule 1   No current facility-administered medications for this visit.   There were no vitals taken for this visit.  There were no vitals filed for this visit.  Physical Exam: General: Younger than age appearing. No distress  Cardiac: JVP flat. No 3/6 R/LUSB  systolic murmur  Abdomen: Soft, non-distended.  Extremities: Warm and dry.  No edema.  Neuro: A&O x3. Affect pleasant.   1. Chronic systolic CHF: Cardiomyopathy of uncertain etiology. Admitted in 6/25 with PE/DVT, echo at that time (in [place] ) showed EF 20-25%, LAD territory WMAs, moderate-severe low flow/low gradient aortic stenosis, small to moderate pericardial effusion.  - NYHA class I-II.  Euvolemic on exam. - continue lasix  20 mg daily. BMET/BNP today, if Cr up again will change to PRN only  - continue farxiga  10 mg daily. No GU s/s. - restart hydralazine  25 mg tid; continue imdur  30 mg daily.  - stop toprol  with bradycardia and aortic stenosis - She is not currently on ARNI or spironolactone due to elevated creatinine.  - repeat echo scheduled - Ideally, she would have RHC/LHC to assess coronaries with low EF.  We discussed risks/benefits and she agrees to procedure.  Will need to schedule down the road when renal function is stabilized.  2. Aortic Stenosis: Low flow, low gradient on echo 6/25 - loud systolic murmur present - repeat echo ordered - asymptomatic; no dizziness or near syncope - may need TEE and TAVR eval in the future 2. CKD stage 3: Last creatinine in 9/25 was 2.12.  - On SGLT2 inhibitor.  - Needs nephrologist.  - BMET today 3. PE/DVT: In 6/25, triggered by long car ride. She had not had a prior event.  - With low EF, would continue Eliquis  long-term.  4. Hyperlipidemia: Check lipids today, aim for LDL < 55. LDL 120 11/25. - continue atorvastatin  80 mg daily - refer to lipid clinic 5. HTN: BP elevated, restart hydral.   Follow up in 1 month with APP  Harlene CHRISTELLA Gainer, FNP 07/05/2024  "

## 2024-07-07 ENCOUNTER — Ambulatory Visit (HOSPITAL_COMMUNITY): Payer: Self-pay | Admitting: Family Medicine

## 2024-07-07 ENCOUNTER — Encounter (HOSPITAL_COMMUNITY): Payer: Self-pay

## 2024-07-07 ENCOUNTER — Ambulatory Visit (HOSPITAL_COMMUNITY)
Admission: RE | Admit: 2024-07-07 | Discharge: 2024-07-07 | Disposition: A | Discharge status: Home / Self Care | Source: Ambulatory Visit | Attending: Family Medicine | Admitting: Family Medicine

## 2024-07-07 VITALS — BP 150/80 | HR 83 | Ht 67.0 in | Wt 162.2 lb

## 2024-07-07 DIAGNOSIS — Z86711 Personal history of pulmonary embolism: Secondary | ICD-10-CM | POA: Insufficient documentation

## 2024-07-07 DIAGNOSIS — N1832 Chronic kidney disease, stage 3b: Secondary | ICD-10-CM | POA: Insufficient documentation

## 2024-07-07 DIAGNOSIS — I10 Essential (primary) hypertension: Secondary | ICD-10-CM

## 2024-07-07 DIAGNOSIS — I5020 Unspecified systolic (congestive) heart failure: Secondary | ICD-10-CM | POA: Diagnosis not present

## 2024-07-07 DIAGNOSIS — I5022 Chronic systolic (congestive) heart failure: Secondary | ICD-10-CM | POA: Insufficient documentation

## 2024-07-07 DIAGNOSIS — Z7901 Long term (current) use of anticoagulants: Secondary | ICD-10-CM | POA: Diagnosis not present

## 2024-07-07 DIAGNOSIS — I350 Nonrheumatic aortic (valve) stenosis: Secondary | ICD-10-CM | POA: Insufficient documentation

## 2024-07-07 DIAGNOSIS — Z86718 Personal history of other venous thrombosis and embolism: Secondary | ICD-10-CM | POA: Insufficient documentation

## 2024-07-07 DIAGNOSIS — Z7984 Long term (current) use of oral hypoglycemic drugs: Secondary | ICD-10-CM | POA: Insufficient documentation

## 2024-07-07 DIAGNOSIS — R0602 Shortness of breath: Secondary | ICD-10-CM | POA: Diagnosis present

## 2024-07-07 DIAGNOSIS — Z79899 Other long term (current) drug therapy: Secondary | ICD-10-CM | POA: Diagnosis not present

## 2024-07-07 DIAGNOSIS — R300 Dysuria: Secondary | ICD-10-CM | POA: Diagnosis not present

## 2024-07-07 DIAGNOSIS — E785 Hyperlipidemia, unspecified: Secondary | ICD-10-CM | POA: Diagnosis not present

## 2024-07-07 DIAGNOSIS — I130 Hypertensive heart and chronic kidney disease with heart failure and stage 1 through stage 4 chronic kidney disease, or unspecified chronic kidney disease: Secondary | ICD-10-CM | POA: Insufficient documentation

## 2024-07-07 DIAGNOSIS — N183 Chronic kidney disease, stage 3 unspecified: Secondary | ICD-10-CM | POA: Diagnosis present

## 2024-07-07 LAB — URINALYSIS, ROUTINE W REFLEX MICROSCOPIC
Bacteria, UA: NONE SEEN
Bilirubin Urine: NEGATIVE
Glucose, UA: >=500 mg/dL — AB
Hgb urine dipstick: NEGATIVE
Ketones, ur: NEGATIVE mg/dL
Nitrite: NEGATIVE
Protein, ur: 100 mg/dL — AB
Specific Gravity, Urine: 1.014 (ref 1.005–1.030)
pH: 5 (ref 5.0–8.0)

## 2024-07-07 LAB — BASIC METABOLIC PANEL WITH GFR
Anion gap: 11 (ref 5–15)
BUN: 33 mg/dL — ABNORMAL HIGH (ref 8–23)
CO2: 25 mmol/L (ref 22–32)
Calcium: 9.3 mg/dL (ref 8.9–10.3)
Chloride: 104 mmol/L (ref 98–111)
Creatinine, Ser: 2.38 mg/dL — ABNORMAL HIGH (ref 0.44–1.00)
GFR, Estimated: 20 mL/min — ABNORMAL LOW
Glucose, Bld: 219 mg/dL — ABNORMAL HIGH (ref 70–99)
Potassium: 4.3 mmol/L (ref 3.5–5.1)
Sodium: 140 mmol/L (ref 135–145)

## 2024-07-07 LAB — PRO BRAIN NATRIURETIC PEPTIDE: Pro Brain Natriuretic Peptide: 5033 pg/mL — ABNORMAL HIGH

## 2024-07-07 MED ORDER — HYDRALAZINE HCL 25 MG PO TABS: 25.0000 mg | ORAL_TABLET | Freq: Three times a day (TID) | ORAL | 3 refills | Status: AC

## 2024-07-07 NOTE — Patient Instructions (Addendum)
 Good to see you today!   RESTART hydralazine  25 mg Three times a day  Labs done today, your results will be available in MyChart, we will contact you for abnormal readings.  Your physician recommends that you schedule a follow-up appointment 3 months(April) call  office in  February to schedule an appointment  If you have any questions or concerns before your next appointment please send us  a message through [place] or call our office at [phone].    TO LEAVE A MESSAGE FOR THE NURSE SELECT OPTION 2, PLEASE LEAVE A MESSAGE INCLUDING: YOUR NAME DATE OF BIRTH CALL BACK NUMBER REASON FOR CALL**this is important as we prioritize the call backs  YOU WILL RECEIVE A CALL BACK THE SAME DAY AS LONG AS YOU CALL BEFORE 4:00 PM At the [facility], you and your health needs are our priority. As part of our continuing mission to provide you with exceptional heart care, we have created designated Provider Care Teams. These Care Teams include your primary Cardiologist (physician) and Advanced Practice Providers (APPs- Physician Assistants and Nurse Practitioners) who all work together to provide you with the care you need, when you need it.   You may see any of the following providers on your designated Care Team at your next follow up: Dr Toribio Fuel Dr Ezra Shuck Dr. Morene Brownie Greig Mosses, NP Caffie Shed, [place] [facility] [place], [place] Beckey Coe, NP [name] Lee, NP Ellouise Class, NP Tinnie Redman, PharmD Jaun Bash, PharmD   Please be sure to bring in all your medications bottles to every appointment.    Thank you for choosing [facility]

## 2024-07-17 ENCOUNTER — Telehealth: Payer: Self-pay | Admitting: Internal Medicine

## 2024-07-17 MED ORDER — FARXIGA 10 MG PO TABS: 10.0000 mg | ORAL_TABLET | Freq: Every day | ORAL | 1 refills | Status: AC

## 2024-07-17 NOTE — Telephone Encounter (Signed)
 Spoke to patient and she says that cardiology told her to increase farxiga  to twice a day  but she has since has ran out          Copied from CRM [id number]. Topic: Clinical - Medication Question >> Jul 17, 2024  9:44 AM Carlyon D wrote: Reason for CRM:  Pt is calling in regards to medication FARXIGA  10 MG TABS tablet, pt is calling as she stated this was prescribed by an ER Dr. And has no refills but wants to stay on it and get t refilled she is asking if pcp can start prescribing this medication for her? Please reach out to pt for further info

## 2024-07-17 NOTE — Telephone Encounter (Signed)
 Pt was notified.

## 2024-07-21 ENCOUNTER — Ambulatory Visit: Admitting: Family Medicine

## 2024-07-24 ENCOUNTER — Other Ambulatory Visit: Payer: Self-pay | Admitting: Family Medicine

## 2024-07-24 DIAGNOSIS — I5020 Unspecified systolic (congestive) heart failure: Secondary | ICD-10-CM

## 2024-07-25 ENCOUNTER — Encounter: Payer: Self-pay | Admitting: Family Medicine

## 2024-07-25 ENCOUNTER — Ambulatory Visit: Admitting: Family Medicine

## 2024-07-25 VITALS — BP 144/70 | HR 59 | Wt 166.8 lb

## 2024-07-25 DIAGNOSIS — N1832 Chronic kidney disease, stage 3b: Secondary | ICD-10-CM | POA: Diagnosis not present

## 2024-07-25 DIAGNOSIS — Z7984 Long term (current) use of oral hypoglycemic drugs: Secondary | ICD-10-CM | POA: Diagnosis not present

## 2024-07-25 DIAGNOSIS — D508 Other iron deficiency anemias: Secondary | ICD-10-CM | POA: Diagnosis not present

## 2024-07-25 DIAGNOSIS — E118 Type 2 diabetes mellitus with unspecified complications: Secondary | ICD-10-CM

## 2024-07-25 DIAGNOSIS — I5020 Unspecified systolic (congestive) heart failure: Secondary | ICD-10-CM

## 2024-07-25 DIAGNOSIS — I1A0 Resistant hypertension: Secondary | ICD-10-CM | POA: Diagnosis not present

## 2024-07-25 DIAGNOSIS — E559 Vitamin D deficiency, unspecified: Secondary | ICD-10-CM | POA: Diagnosis not present

## 2024-07-25 LAB — POCT GLYCOSYLATED HEMOGLOBIN (HGB A1C): Hemoglobin A1C: 6.5 % — AB (ref 4.0–5.6)

## 2024-07-25 MED ORDER — VITAMIN D (ERGOCALCIFEROL) 1.25 MG (50000 UNIT) PO CAPS: 50000.0000 [IU] | ORAL_CAPSULE | ORAL | 1 refills | Status: AC

## 2024-07-25 MED ORDER — METOPROLOL SUCCINATE ER 25 MG PO TB24: 25.0000 mg | ORAL_TABLET | Freq: Every day | ORAL | 1 refills | Status: AC

## 2024-07-25 NOTE — Patient Instructions (Signed)
 VISIT SUMMARY:  Today, we discussed your ongoing issues with fatigue, high blood pressure, and chronic conditions including heart failure, chronic kidney disease, and anemia. We reviewed your current medications and made some adjustments to better manage your symptoms.  YOUR PLAN:  -HEART FAILURE WITH REDUCED EJECTION FRACTION: Heart failure with reduced ejection fraction means your heart is not pumping blood as well as it should, which can cause fatigue and low hemoglobin. We will continue your current heart medications and reassess your condition in six weeks.  -STAGE 3B CHRONIC KIDNEY DISEASE: Stage 3b chronic kidney disease means your kidneys are moderately damaged and not working as well as they should. This condition is being managed with your current treatment plan, and we will reassess in six weeks.  -RESISTANT HYPERTENSION: Resistant hypertension means your high blood pressure is not well controlled despite taking medications. You should take hydralazine  three times a day as prescribed to help manage your blood pressure.  -IRON DEFICIENCY ANEMIA: Iron deficiency anemia means you have low levels of iron, which can cause fatigue and is likely related to your chronic kidney disease. You should take iron tablets once daily with orange juice, and we will repeat your iron labs in six weeks. If your hemoglobin remains low, we may consider an iron transfusion.  -VITAMIN D  DEFICIENCY: Vitamin D  deficiency means you have low levels of vitamin D , which is important for bone health. We have refilled your vitamin D  prescription to help address this deficiency.  INSTRUCTIONS:  Please follow the updated medication instructions, including taking hydralazine  three times a day and iron tablets once daily with orange juice. We will reassess your heart failure, chronic kidney disease, and iron levels in six weeks. If you have any concerns or experience any new symptoms, please contact our office.

## 2024-07-25 NOTE — Progress Notes (Signed)
 "  Name: Joy Moreno   Date of Visit: 07/25/24   Date of last visit with me: 07/24/2024   CHIEF COMPLAINT:  Chief Complaint  Patient presents with   Follow-up    2 month follow up from last visit . Had blood work done yesterday at the kidney center.        HPI:  Discussed the use of AI scribe software for clinical note transcription with the patient, who gave verbal consent to proceed.  History of Present Illness   Joy Moreno is an 84 year old female with chronic kidney disease and hypertension who presents with fatigue and high blood pressure.  She experiences fatigue, which she attributes to low iron levels as informed by a staff member at the kidney center. She has a history of low iron and low hemoglobin, both related to her kidney function. She has not yet received the results of her recent lab work. No issues with constipation.  Her blood pressure remains elevated, with recent readings of 162/88 mmHg and 150/80 mmHg on January 2nd, and 144/70 mmHg today. At home, her blood pressure tends to be lower, around 120 mmHg. She is currently taking hydralazine  two times a day, although she was previously instructed to take it three times a day.  She is currently taking 20 mg of a diuretic once a day, reduced from a previous dose of 40 mg. She feels sleepy rather than experiencing shortness of breath, which she associates with her low hemoglobin levels. Her sleep pattern is disrupted, as she goes to bed late around midnight and wakes up at 7 AM, often sleeping during the day.  Her chronic kidney disease has been ongoing for about a year or two.         OBJECTIVE:       05/22/2024   11:29 AM  Depression screen PHQ 2/9  Decreased Interest 0  Down, Depressed, Hopeless 0  PHQ - 2 Score 0     BP Readings from Last 3 Encounters:  07/25/24 (!) 144/70  07/07/24 (!) 150/80  05/31/24 (!) 152/76    BP (!) 144/70   Pulse (!) 59   Wt 166 lb 12.8 oz (75.7 kg)   SpO2 99%    BMI 26.12 kg/m    Physical Exam   VITALS: BP- 144/70      Physical Exam Constitutional:      Appearance: Normal appearance.  Neurological:     General: No focal deficit present.     Mental Status: She is alert and oriented to person, place, and time. Mental status is at baseline.     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 Cardiac function prioritized to improve blood counts. - Continue current heart medications. - Reassess in six weeks.  Stage 3b chronic kidney disease Chronic kidney disease stage 3b exacerbated by cardiac issues. - Continue current management. - Reassess in six weeks.  Resistant hypertension Hypertension remains elevated. Current hydralazine  regimen not followed as prescribed. - Instructed to take hydralazine  three times a day.  Iron deficiency anemia Likely secondary to  chronic kidney disease. Hemoglobin decreased from 11 to 9. Fatigue likely related to low hemoglobin. - Prescribed iron tablets once daily with orange juice. - Repeat iron labs in six weeks. - Consider iron transfusion if hemoglobin remains low.  Vitamin D  deficiency Vitamin D  deficiency requiring supplementation. - Refilled vitamin D  prescription.         Joy Moreno A. Vita MD [facility] "

## 2024-07-26 ENCOUNTER — Encounter (HOSPITAL_COMMUNITY): Payer: Self-pay | Admitting: Internal Medicine

## 2024-07-26 ENCOUNTER — Other Ambulatory Visit (HOSPITAL_COMMUNITY): Payer: Self-pay | Admitting: Internal Medicine

## 2024-07-26 DIAGNOSIS — D631 Anemia in chronic kidney disease: Secondary | ICD-10-CM | POA: Insufficient documentation

## 2024-07-26 DIAGNOSIS — D509 Iron deficiency anemia, unspecified: Secondary | ICD-10-CM | POA: Insufficient documentation

## 2024-07-26 NOTE — Progress Notes (Signed)
 Received IV iron referral from [facility]/Dr. Norine  Directions written as follows:  Feraheme dose is 510mg  x 3 -> total 1530mg  over treatment cycle Or alternate provided was Venofer dose is 300mg  x 2 -> total is 600mg  over treatment cycle  Faxed MDO for clarification on total dosing/regimen  Sherry Pennant, PharmD, MPH, BCPS, CPP Clinical Pharmacist

## 2024-07-26 NOTE — Progress Notes (Signed)
 Dagoberto spoke with Gerrie - Feraheme dose is 510mg  IV x 2  If insurance prefers, can do Venofer 300mg  IV x 2.  Sherry Pennant, PharmD, MPH, BCPS, CPP Clinical Pharmacist

## 2024-08-02 ENCOUNTER — Other Ambulatory Visit: Payer: Self-pay | Admitting: Family Medicine

## 2024-08-02 DIAGNOSIS — K279 Peptic ulcer, site unspecified, unspecified as acute or chronic, without hemorrhage or perforation: Secondary | ICD-10-CM

## 2024-08-08 ENCOUNTER — Telehealth (HOSPITAL_COMMUNITY): Payer: Self-pay | Admitting: Pharmacy Technician

## 2024-08-23 ENCOUNTER — Ambulatory Visit: Admitting: Pharmacist

## 2024-09-11 ENCOUNTER — Ambulatory Visit: Admitting: Family Medicine
